# Patient Record
Sex: Male | Born: 1965 | Race: Asian | Hispanic: No | Marital: Married | Smoking: Current some day smoker
Health system: Southern US, Community
[De-identification: ages and names within clinical notes are randomized; demographics above are authoritative.]

## PROBLEM LIST (undated history)

## (undated) DIAGNOSIS — I10 Essential (primary) hypertension: Secondary | ICD-10-CM

## (undated) DIAGNOSIS — K219 Gastro-esophageal reflux disease without esophagitis: Secondary | ICD-10-CM

## (undated) HISTORY — PX: VASECTOMY: SHX75

---

## 2013-03-03 ENCOUNTER — Other Ambulatory Visit: Payer: Self-pay | Admitting: Infectious Diseases

## 2013-03-03 ENCOUNTER — Ambulatory Visit
Admission: RE | Admit: 2013-03-03 | Discharge: 2013-03-03 | Disposition: A | Discharge status: Home / Self Care | Payer: No Typology Code available for payment source | Source: Ambulatory Visit | Attending: Infectious Diseases | Admitting: Infectious Diseases

## 2013-03-03 DIAGNOSIS — Z111 Encounter for screening for respiratory tuberculosis: Secondary | ICD-10-CM

## 2014-10-01 ENCOUNTER — Other Ambulatory Visit: Payer: Self-pay | Admitting: Gastroenterology

## 2014-10-01 DIAGNOSIS — R1013 Epigastric pain: Secondary | ICD-10-CM

## 2014-10-15 ENCOUNTER — Ambulatory Visit
Admission: RE | Admit: 2014-10-15 | Discharge: 2014-10-15 | Disposition: A | Discharge status: Home / Self Care | Payer: Medicaid Other | Source: Ambulatory Visit | Attending: Gastroenterology | Admitting: Gastroenterology

## 2014-10-15 DIAGNOSIS — R1013 Epigastric pain: Secondary | ICD-10-CM

## 2016-11-26 ENCOUNTER — Emergency Department (HOSPITAL_COMMUNITY): Payer: BLUE CROSS/BLUE SHIELD

## 2016-11-26 ENCOUNTER — Emergency Department (HOSPITAL_COMMUNITY)
Admission: EM | Admit: 2016-11-26 | Discharge: 2016-11-26 | Disposition: A | Discharge status: Home / Self Care | Payer: BLUE CROSS/BLUE SHIELD | Attending: Emergency Medicine | Admitting: Emergency Medicine

## 2016-11-26 DIAGNOSIS — W1830XA Fall on same level, unspecified, initial encounter: Secondary | ICD-10-CM | POA: Insufficient documentation

## 2016-11-26 DIAGNOSIS — Y999 Unspecified external cause status: Secondary | ICD-10-CM | POA: Insufficient documentation

## 2016-11-26 DIAGNOSIS — R930 Abnormal findings on diagnostic imaging of skull and head, not elsewhere classified: Secondary | ICD-10-CM | POA: Diagnosis not present

## 2016-11-26 DIAGNOSIS — S52292A Other fracture of shaft of left ulna, initial encounter for closed fracture: Secondary | ICD-10-CM | POA: Diagnosis not present

## 2016-11-26 DIAGNOSIS — Y92002 Bathroom of unspecified non-institutional (private) residence single-family (private) house as the place of occurrence of the external cause: Secondary | ICD-10-CM | POA: Diagnosis not present

## 2016-11-26 DIAGNOSIS — Y939 Activity, unspecified: Secondary | ICD-10-CM | POA: Diagnosis not present

## 2016-11-26 DIAGNOSIS — S59912A Unspecified injury of left forearm, initial encounter: Secondary | ICD-10-CM | POA: Diagnosis present

## 2016-11-26 LAB — CBC WITH DIFFERENTIAL/PLATELET
BASOS PCT: 1 %
Basophils Absolute: 0 10*3/uL (ref 0.0–0.1)
EOS ABS: 0.2 10*3/uL (ref 0.0–0.7)
Eosinophils Relative: 3 %
HEMATOCRIT: 35.8 % — AB (ref 39.0–52.0)
HEMOGLOBIN: 12.3 g/dL — AB (ref 13.0–17.0)
LYMPHS ABS: 1 10*3/uL (ref 0.7–4.0)
Lymphocytes Relative: 18 %
MCH: 30.8 pg (ref 26.0–34.0)
MCHC: 34.4 g/dL (ref 30.0–36.0)
MCV: 89.5 fL (ref 78.0–100.0)
MONO ABS: 0.3 10*3/uL (ref 0.1–1.0)
MONOS PCT: 6 %
NEUTROS PCT: 72 %
Neutro Abs: 3.8 10*3/uL (ref 1.7–7.7)
Platelets: 177 10*3/uL (ref 150–400)
RBC: 4 MIL/uL — ABNORMAL LOW (ref 4.22–5.81)
RDW: 12.7 % (ref 11.5–15.5)
WBC: 5.3 10*3/uL (ref 4.0–10.5)

## 2016-11-26 LAB — I-STAT TROPONIN, ED: Troponin i, poc: 0 ng/mL (ref 0.00–0.08)

## 2016-11-26 LAB — I-STAT CHEM 8, ED
BUN: 11 mg/dL (ref 6–20)
CALCIUM ION: 1.11 mmol/L — AB (ref 1.15–1.40)
CREATININE: 0.9 mg/dL (ref 0.61–1.24)
Chloride: 106 mmol/L (ref 101–111)
Glucose, Bld: 96 mg/dL (ref 65–99)
HCT: 35 % — ABNORMAL LOW (ref 39.0–52.0)
HEMOGLOBIN: 11.9 g/dL — AB (ref 13.0–17.0)
Potassium: 3.9 mmol/L (ref 3.5–5.1)
SODIUM: 141 mmol/L (ref 135–145)
TCO2: 25 mmol/L (ref 0–100)

## 2016-11-26 MED ORDER — SODIUM CHLORIDE 0.9 % IV BOLUS (SEPSIS)
1000.0000 mL | Freq: Once | INTRAVENOUS | Status: AC
  Administered 2016-11-26: 1000 mL via INTRAVENOUS

## 2016-11-26 MED ORDER — DICLOFENAC SODIUM ER 100 MG PO TB24: 100.0000 mg | ORAL_TABLET | Freq: Every day | ORAL | 0 refills | Status: DC

## 2016-11-26 MED ORDER — KETOROLAC TROMETHAMINE 30 MG/ML IJ SOLN
30.0000 mg | Freq: Once | INTRAMUSCULAR | Status: AC
  Administered 2016-11-26: 30 mg via INTRAVENOUS
  Filled 2016-11-26: qty 1

## 2016-11-26 NOTE — ED Notes (Signed)
No respiratory or acute distress noted alert and oriented x 3 call light in reach. 

## 2016-11-26 NOTE — ED Notes (Signed)
Patient transported to X-ray, NS bolus was started on pt.

## 2016-11-26 NOTE — ED Provider Notes (Signed)
[facility]-EMERGENCY DEPT Provider Note   CSN: [id number] Arrival date & time: 11/26/16  0003 By signing my name below, I, Bridgette Habermann, attest that this documentation has been prepared under the direction and in the presence of Geordan Xu, MD. Electronically Signed: Bridgette Habermann, ED Scribe. 11/26/16. 12:16 AM.  History   Chief Complaint Chief Complaint  Patient presents with  . Fall    left wrist and dental pain    HPI Comments: Luke Hernandez is a 50 y.o. male with no pertinent PMHx, who presents to the Emergency Department by EMS complaining of 6/10 left wrist pain s/p mechanical fall one hour ago. Pt reports he fell in the bathroom while coming out of the shower; he felt room-spinning dizziness just prior to his fall. Pt states he always is dizzy at baseline but notes this episode was worse. Pain is exacerbated with movement and palpation. He has not tried any OTC medications PTA. Pt is not a smoker and does not drink alcohol. He denies fever, chills, or any other associated symptoms.   The history is provided by the patient. A language interpreter was used.  Wrist Pain  This is a new problem. The current episode started less than 1 hour ago. The problem occurs constantly. The problem has not changed since onset.The symptoms are aggravated by bending, twisting and standing. Nothing relieves the symptoms. He has tried nothing for the symptoms. The treatment provided no relief.    No past medical history on file.  There are no active problems to display for this patient.  No past surgical history on file.   Home Medications    Prior to Admission medications   Not on File    Family History No family history on file.  Social History Social History  Substance Use Topics  . Smoking status: Not on file  . Smokeless tobacco: Not on file  . Alcohol use Not on file     Allergies   Patient has no allergy information on record.   Review of Systems Review of Systems  Constitutional:  Negative for chills and fever.  Musculoskeletal: Positive for myalgias.  Neurological: Positive for dizziness.  All other systems reviewed and are negative.    Physical Exam Updated Vital Signs BP 148/96 (BP Location: Right Arm)   Pulse 111   Temp 98.2 F (36.8 C) (Oral)   Resp 20   SpO2 98%   Physical Exam  Constitutional: He is oriented to person, place, and time. He appears well-developed and well-nourished.  HENT:  Head: Normocephalic.  Right Ear: No hemotympanum.  Left Ear: No hemotympanum.  Mouth/Throat: Oropharynx is clear and moist. No oropharyngeal exudate.  Eyes: Conjunctivae and EOM are normal. Pupils are equal, round, and reactive to light. Right eye exhibits no discharge. Left eye exhibits no discharge. No scleral icterus.  Neck: Normal range of motion. Neck supple. No JVD present. No tracheal deviation present.  Trachea is midline. No stridor or carotid bruits.  Cardiovascular: Normal rate, regular rhythm, normal heart sounds and intact distal pulses.   No murmur heard. Pulmonary/Chest: Effort normal and breath sounds normal. No stridor. No respiratory distress. He has no wheezes. He has no rales.  Lungs CTA bilaterally.  Abdominal: Soft. Bowel sounds are normal. He exhibits no distension. There is no tenderness. There is no rebound and no guarding.  Musculoskeletal: Normal range of motion. He exhibits tenderness. He exhibits no edema or deformity.  All compartments are soft. No palpable cords. No deformities of the lower extremity.  Tenderness to palpation over left medial wrist. No obvious deformity. Medial pulse 3+.   Lymphadenopathy:    He has no cervical adenopathy.  Neurological: He is alert and oriented to person, place, and time. He has normal reflexes. He displays normal reflexes.  Skin: Skin is warm and dry. Capillary refill takes less than 2 seconds.  Psychiatric: He has a normal mood and affect. His behavior is normal.  Nursing note and vitals  reviewed.    ED Treatments / Results  DIAGNOSTIC STUDIES: Oxygen Saturation is 98% on RA, normal by my interpretation.    COORDINATION OF CARE: 12:15 AM Discussed treatment plan with pt at bedside which includes x-ray and pt agreed to plan.   EKG  EKG Interpretation  Date/Time:  Monday November 26 2016 00:20:42 EST Ventricular Rate:  82 PR Interval:    QRS Duration: 97 QT Interval:  370 QTC Calculation: 433 R Axis:   59 Text Interpretation:  Sinus rhythm Confirmed by [facility]  [facility], Nkosi Cortright ([id number]) on 11/26/2016 12:24:53 AM      Results for orders placed or performed during the hospital encounter of 11/26/16  CBC with Differential/Platelet  Result Value Ref Range   WBC 5.3 4.0 - 10.5 K/uL   RBC 4.00 (L) 4.22 - 5.81 MIL/uL   Hemoglobin 12.3 (L) 13.0 - 17.0 g/dL   HCT 60.435.8 (L) 54.039.0 - 98.152.0 %   MCV 89.5 78.0 - 100.0 fL   MCH 30.8 26.0 - 34.0 pg   MCHC 34.4 30.0 - 36.0 g/dL   RDW 19.112.7 47.811.5 - 29.515.5 %   Platelets 177 150 - 400 K/uL   Neutrophils Relative % 72 %   Neutro Abs 3.8 1.7 - 7.7 K/uL   Lymphocytes Relative 18 %   Lymphs Abs 1.0 0.7 - 4.0 K/uL   Monocytes Relative 6 %   Monocytes Absolute 0.3 0.1 - 1.0 K/uL   Eosinophils Relative 3 %   Eosinophils Absolute 0.2 0.0 - 0.7 K/uL   Basophils Relative 1 %   Basophils Absolute 0.0 0.0 - 0.1 K/uL  I-Stat Chem 8, ED  Result Value Ref Range   Sodium 141 135 - 145 mmol/L   Potassium 3.9 3.5 - 5.1 mmol/L   Chloride 106 101 - 111 mmol/L   BUN 11 6 - 20 mg/dL   Creatinine, Ser 6.210.90 0.61 - 1.24 mg/dL   Glucose, Bld 96 65 - 99 mg/dL   Calcium, Ion 3.081.11 (L) 1.15 - 1.40 mmol/L   TCO2 25 0 - 100 mmol/L   Hemoglobin 11.9 (L) 13.0 - 17.0 g/dL   HCT 65.735.0 (L) 84.639.0 - 96.252.0 %  I-stat troponin, ED  Result Value Ref Range   Troponin i, poc 0.00 0.00 - 0.08 ng/mL   Comment 3           Dg Wrist Complete Left  Result Date: 11/26/2016 CLINICAL DATA:  Status post fall, with left wrist pain. Initial encounter. EXAM: LEFT WRIST -  COMPLETE 3+ VIEW COMPARISON:  None. FINDINGS: There is a mildly displaced fracture through the distal ulnar diaphysis, with mild ulnar displacement. Surrounding soft tissue swelling is noted. The distal radius appears intact. The carpal rows are grossly unremarkable in appearance. Visualized joint spaces are preserved. IMPRESSION: Mildly displaced fracture through the distal ulnar diaphysis, with mild ulnar displacement. Electronically Signed   By: Roanna RaiderJeffery  Chang M.D.   On: 11/26/2016 01:13   Ct Head Wo Contrast  Result Date: 11/26/2016 CLINICAL DATA:  Acute onset of dizziness while getting out  of shower. Status post fall. Concern for head or cervical spine injury. Initial encounter. EXAM: CT HEAD WITHOUT CONTRAST CT CERVICAL SPINE WITHOUT CONTRAST TECHNIQUE: Multidetector CT imaging of the head and cervical spine was performed following the standard protocol without intravenous contrast. Multiplanar CT image reconstructions of the cervical spine were also generated. COMPARISON:  None. FINDINGS: CT HEAD FINDINGS Brain: No evidence of acute infarction, hemorrhage, hydrocephalus, extra-axial collection or mass lesion/mass effect. The posterior fossa, including the cerebellum, brainstem and fourth ventricle, is within normal limits. The third and lateral ventricles, and basal ganglia are unremarkable in appearance. The cerebral hemispheres are symmetric in appearance, with normal gray-white differentiation. No mass effect or midline shift is seen. Vascular: No hyperdense vessel or unexpected calcification. Skull: There is no evidence of fracture; visualized osseous structures are unremarkable in appearance. Sinuses/Orbits: The orbits are within normal limits. The paranasal sinuses and mastoid air cells are well-aerated. Other: No significant soft tissue abnormalities are seen. CT CERVICAL SPINE FINDINGS Alignment: Normal. Skull base and vertebrae: No acute fracture. No primary bone lesion or focal pathologic  process. Soft tissues and spinal canal: No prevertebral fluid or swelling. No visible canal hematoma. Disc levels: Intervertebral disc spaces are preserved. The bony foramina are grossly unremarkable in appearance. Upper chest: The thyroid gland is mildly enlarged and heterogeneous in appearance. The visualized lung apices are clear. Other: No additional soft tissue abnormalities are seen. IMPRESSION: 1. No evidence of traumatic intracranial injury or fracture. 2. No evidence of fracture or subluxation along the cervical spine. Electronically Signed   By: Roanna RaiderJeffery  Chang M.D.   On: 11/26/2016 01:10   Ct Cervical Spine Wo Contrast  Result Date: 11/26/2016 CLINICAL DATA:  Acute onset of dizziness while getting out of shower. Status post fall. Concern for head or cervical spine injury. Initial encounter. EXAM: CT HEAD WITHOUT CONTRAST CT CERVICAL SPINE WITHOUT CONTRAST TECHNIQUE: Multidetector CT imaging of the head and cervical spine was performed following the standard protocol without intravenous contrast. Multiplanar CT image reconstructions of the cervical spine were also generated. COMPARISON:  None. FINDINGS: CT HEAD FINDINGS Brain: No evidence of acute infarction, hemorrhage, hydrocephalus, extra-axial collection or mass lesion/mass effect. The posterior fossa, including the cerebellum, brainstem and fourth ventricle, is within normal limits. The third and lateral ventricles, and basal ganglia are unremarkable in appearance. The cerebral hemispheres are symmetric in appearance, with normal gray-white differentiation. No mass effect or midline shift is seen. Vascular: No hyperdense vessel or unexpected calcification. Skull: There is no evidence of fracture; visualized osseous structures are unremarkable in appearance. Sinuses/Orbits: The orbits are within normal limits. The paranasal sinuses and mastoid air cells are well-aerated. Other: No significant soft tissue abnormalities are seen. CT CERVICAL SPINE  FINDINGS Alignment: Normal. Skull base and vertebrae: No acute fracture. No primary bone lesion or focal pathologic process. Soft tissues and spinal canal: No prevertebral fluid or swelling. No visible canal hematoma. Disc levels: Intervertebral disc spaces are preserved. The bony foramina are grossly unremarkable in appearance. Upper chest: The thyroid gland is mildly enlarged and heterogeneous in appearance. The visualized lung apices are clear. Other: No additional soft tissue abnormalities are seen. IMPRESSION: 1. No evidence of traumatic intracranial injury or fracture. 2. No evidence of fracture or subluxation along the cervical spine. Electronically Signed   By: Roanna RaiderJeffery  Chang M.D.   On: 11/26/2016 01:10   Dg Hand Complete Left  Result Date: 11/26/2016 CLINICAL DATA:  Status post fall, with left wrist pain. Initial encounter. EXAM: LEFT HAND -  COMPLETE 3+ VIEW COMPARISON:  None. FINDINGS: There is no evidence of fracture or dislocation. The joint spaces are preserved. The carpal rows are intact, and demonstrate normal alignment. The soft tissues are unremarkable in appearance. IMPRESSION: No evidence of fracture or dislocation. Electronically Signed   By: Roanna Raider M.D.   On: 11/26/2016 01:14     Procedures Procedures (including critical care time)  Medications Ordered in ED Medications  ketorolac (TORADOL) 30 MG/ML injection 30 mg (not administered)  sodium chloride 0.9 % bolus 1,000 mL (0 mLs Intravenous Stopped 11/26/16 0133)      Final Clinical Impressions(s) / ED Diagnoses   Ulnar wrist fracture, consistent with defensive injury not a fall.  Patient is well known to scribe who states he is a drinker. Given this information I do not think it prudent to prescribe narcotics to this patient. Splinted in the ED will need to follow up with orthopedics for ongoing treatment.    All questions answered to patient's satisfaction. Based on history and exam patient has been appropriately  medically screened and emergency conditions excluded. Patient is stable for discharge at this time. Follow up with your PMD for recheck in 2 days and strict return precautions given.   I personally performed the services described in this documentation, which was scribed in my presence. The recorded information has been reviewed and is accurate.       Cy Blamer, MD 11/26/16 [phone]

## 2016-11-26 NOTE — ED Triage Notes (Signed)
V/s on arrival 169/109., hr 81, room air 94, rr16 reg, cbg 113.  LVh on ekg

## 2016-11-26 NOTE — ED Notes (Signed)
Bed: [id number] Expected date:  Expected time:  Means of arrival:  Comments: 50yo/Fall/L wrist pain

## 2016-11-26 NOTE — ED Notes (Signed)
Pt has pain in left wrist.  Wrist elevated on a pillow.  Will notify EDP

## 2016-11-26 NOTE — Progress Notes (Signed)
Orthopedic Tech Progress Note Patient Details:  Luke Hernandez 08/01/1966 [id number]  Ortho Devices Type of Ortho Device: Arm sling, Sugartong splint Ortho Device/Splint Location: lue Ortho Device/Splint Interventions: Ordered, Application Applied splint as per drs verbal order  [name], [name] 11/26/2016, 3:37 AM

## 2016-11-26 NOTE — ED Triage Notes (Signed)
Pt comes to ed, via ems, fall one hour ago coming out of shower. Pt is needing interrupter napa lese.  Pt reports dizzness before his fall. Pt also verbalizes left wrist pain. No deformity noted. Pain 6 out 10. Pt hx of non active TB. 18 in RFA, fentyle 100 mcg given.   Pt comes from home, [address]. Phone contact [phone] wife.

## 2016-12-03 ENCOUNTER — Ambulatory Visit (INDEPENDENT_AMBULATORY_CARE_PROVIDER_SITE_OTHER): Payer: BLUE CROSS/BLUE SHIELD | Admitting: Physician Assistant

## 2016-12-03 ENCOUNTER — Encounter (INDEPENDENT_AMBULATORY_CARE_PROVIDER_SITE_OTHER): Payer: Self-pay

## 2016-12-03 DIAGNOSIS — S52202G Unspecified fracture of shaft of left ulna, subsequent encounter for closed fracture with delayed healing: Secondary | ICD-10-CM

## 2016-12-03 NOTE — Progress Notes (Signed)
   Office Visit Note   Patient: Luke Hernandez           Date of Birth: 10/29/1966           MRN: [id number] Visit Date: 12/03/2016              Requested by: No referring provider defined for this encounter. PCP: No PCP Per Patient   Assessment & Plan: Visit Diagnoses: No diagnosis found.  Plan: Continue with long-arm splint. See him back in a week remove the splint obtain x-rays of the distal ulna . Is likely at that time we'll place him in a cast. Placed him out of work and most likely be out of work for 6-8 weeks.  Follow-Up Instructions: Return in about 1 week (around 12/10/2016) for Radiographs.   Orders:  No orders of the defined types were placed in this encounter.  No orders of the defined types were placed in this encounter.     Procedures: No procedures performed   Clinical Data: No additional findings.   Subjective: Chief Complaint  Patient presents with  . Left Wrist - Fracture    New pt, left wrist pain, broken, pt got out of shower walked to bed and fell went to ED [facility] 12/04, has some pain now, ED gave medication but states is not working.   3 views of the left distal radius ulna shows a left ulna distal diaphysis fracture mildly displaced. No other fractures of the hand or wrist  Review of Systems See HPI   Objective: Vital Signs: There were no vitals taken for this visit.  Physical Exam  Constitutional: He is oriented to person, place, and time. He appears well-developed and well-nourished. No distress.  Neurological: He is alert and oriented to person, place, and time.  Psychiatric: He has a normal mood and affect.    Ortho Exam Left arm well padded long-arm splint.No tenderness at the elbow. Left hand full motor and full sensation. Specialty Comments:  No specialty comments available.  Imaging: No results found.   PMFS History: There are no active problems to display for this patient.  No past medical history on file.  No family  history on file.  No past surgical history on file. Social History   Occupational History  . Not on file.   Social History Main Topics  . Smoking status: Not on file  . Smokeless tobacco: Not on file  . Alcohol use Not on file  . Drug use: Unknown  . Sexual activity: Not on file

## 2016-12-10 ENCOUNTER — Ambulatory Visit (INDEPENDENT_AMBULATORY_CARE_PROVIDER_SITE_OTHER): Payer: BLUE CROSS/BLUE SHIELD | Admitting: Physician Assistant

## 2016-12-10 ENCOUNTER — Ambulatory Visit (INDEPENDENT_AMBULATORY_CARE_PROVIDER_SITE_OTHER): Payer: Self-pay

## 2016-12-10 DIAGNOSIS — S52202G Unspecified fracture of shaft of left ulna, subsequent encounter for closed fracture with delayed healing: Secondary | ICD-10-CM

## 2016-12-10 MED ORDER — TRAMADOL HCL 50 MG PO TABS: 50.0000 mg | ORAL_TABLET | Freq: Four times a day (QID) | ORAL | 0 refills | Status: DC | PRN

## 2016-12-10 NOTE — Progress Notes (Signed)
   Office Visit Note   Patient: Luke Hernandez           Date of Birth: 09/28/1966           MRN: [id number] Visit Date: 12/10/2016              Requested by: No referring provider defined for this encounter. PCP: No PCP Per Patient   Assessment & Plan: Visit Diagnoses:  1. Closed fracture of shaft of left ulna with delayed healing, unspecified fracture morphology, subsequent encounter     Plan:Short arm cast left arm. He is to keep it clean dry and intact see him back in 3 weeks remove the cast and obtain 3 views of the left distal forearm. Tramadol for pain.  Follow-Up Instructions: Return in about 3 weeks (around 12/31/2016).   Orders:  Orders Placed This Encounter  Procedures  . XR Wrist Complete Left   Meds ordered this encounter  Medications  . traMADol (ULTRAM) 50 MG tablet    Sig: Take 1 tablet (50 mg total) by mouth every 6 (six) hours as needed.    Dispense:  30 tablet    Refill:  0      Procedures: No procedures performed   Clinical Data: No additional findings.   Subjective: Chief Complaint  Patient presents with  . Left Wrist - Follow-up    Patient is following up his wrist fracture. He is wearing his thumb spica splint today. He states he's still in a lot of pain.     Review of Systems   Objective: Vital Signs: There were no vitals taken for this visit.  Physical Exam  Ortho Exam Left forearm splints removed no skin breakdown. Radial pulses intact. Is able to wiggle his fingers and has good sensation throughout Specialty Comments:  No specialty comments available.  Imaging: No results found.   PMFS History: There are no active problems to display for this patient.  No past medical history on file.  No family history on file.  No past surgical history on file. Social History   Occupational History  . Not on file.   Social History Main Topics  . Smoking status: Not on file  . Smokeless tobacco: Not on file  . Alcohol use Not on  file  . Drug use: Unknown  . Sexual activity: Not on file

## 2016-12-31 ENCOUNTER — Encounter (INDEPENDENT_AMBULATORY_CARE_PROVIDER_SITE_OTHER): Payer: Self-pay | Admitting: Physician Assistant

## 2016-12-31 ENCOUNTER — Ambulatory Visit (INDEPENDENT_AMBULATORY_CARE_PROVIDER_SITE_OTHER): Payer: BLUE CROSS/BLUE SHIELD | Admitting: Physician Assistant

## 2016-12-31 ENCOUNTER — Ambulatory Visit (INDEPENDENT_AMBULATORY_CARE_PROVIDER_SITE_OTHER): Payer: Self-pay

## 2016-12-31 DIAGNOSIS — M25532 Pain in left wrist: Secondary | ICD-10-CM

## 2016-12-31 DIAGNOSIS — S52222S Displaced transverse fracture of shaft of left ulna, sequela: Secondary | ICD-10-CM

## 2016-12-31 NOTE — Progress Notes (Signed)
   Office Visit Note   Patient: Luke Hernandez           Date of Birth: 02/24/1966           MRN: [id number] Visit Date: 12/31/2016              Requested by: No referring provider defined for this encounter. PCP: No PCP Per Patient   Assessment & Plan: Visit Diagnoses:  1. Closed displaced transverse fracture of shaft of left ulna, sequela     Plan: Placed in a removable long Velcro wrist splint, he will keep this on and treated like a cast except for bathing. We will see him back in 1 month obtain AP and lateral views of his left forearm at that point time most likely we'll discharge him.  Follow-Up Instructions: Return in about 4 weeks (around 01/28/2017) for Radiographs.   Orders:  Orders Placed This Encounter  Procedures  . XR Forearm Left   No orders of the defined types were placed in this encounter.     Procedures: No procedures performed   Clinical Data: No additional findings.   Subjective: Chief Complaint  Patient presents with  . Left Wrist - Follow-up  . Follow-up    HPI Mr.Vandenbrink returns today over a month status post distal left ulnar shaft fracture. He is overall doing well, no complaints. Her brothers present today and aids in conversation. Review of Systems   Objective: Vital Signs: There were no vitals taken for this visit.  Physical Exam  Ortho Exam Left forearm he has full supination pronation. Radial pulses intact. Motor and sensation intact left hand. Tenderness over the fracture site with abundant palpable callus. No rashes skin lesions ulcerations impending ulcers left forearm after the removal of the Cast. Specialty Comments:  No specialty comments available.  Imaging: No results found.   PMFS History: There are no active problems to display for this patient.  No past medical history on file.  No family history on file.  No past surgical history on file. Social History   Occupational History  . Not on file.   Social History Main  Topics  . Smoking status: Never Smoker  . Smokeless tobacco: Current User  . Alcohol use Not on file  . Drug use: Unknown  . Sexual activity: Not on file

## 2017-01-31 ENCOUNTER — Ambulatory Visit (INDEPENDENT_AMBULATORY_CARE_PROVIDER_SITE_OTHER): Payer: Self-pay

## 2017-01-31 ENCOUNTER — Ambulatory Visit (INDEPENDENT_AMBULATORY_CARE_PROVIDER_SITE_OTHER): Payer: BLUE CROSS/BLUE SHIELD | Admitting: Physician Assistant

## 2017-01-31 DIAGNOSIS — M79632 Pain in left forearm: Secondary | ICD-10-CM | POA: Diagnosis not present

## 2017-01-31 DIAGNOSIS — S52202G Unspecified fracture of shaft of left ulna, subsequent encounter for closed fracture with delayed healing: Secondary | ICD-10-CM

## 2017-01-31 NOTE — Progress Notes (Signed)
   Office Visit Note   Patient: Luke Hernandez           Date of Birth: 10/12/1966           MRN: [id number] Visit Date: 01/31/2017              Requested by: No referring provider defined for this encounter. PCP: No PCP Per Patient   Assessment & Plan: Visit Diagnoses:  1. Left forearm pain   2. Unspecified fracture of shaft of left ulna, subsequent encounter for closed fracture with delayed healing     Plan: Continue the removable Velcro splint long arm using as a cast only coming out for bathing. No lifting with the arm. We'll keep him out of work until follow-up in one month. At that time we'll obtain AP and lateral views of the left forearm to evaluate the ulnar shaft fracture.  Follow-Up Instructions: Return in about 4 weeks (around 02/28/2017) for Radiographs.   Orders:  Orders Placed This Encounter  Procedures  . XR Forearm Left   No orders of the defined types were placed in this encounter.     Procedures: No procedures performed   Clinical Data: No additional findings.   Subjective: Chief Complaint  Patient presents with  . Left Wrist - Follow-up    HPI Status post left ulnar shaft fracture 11/26/2016. He follows up today wearing the Velcro long-arm splint. He states overall that the pain is improving but that he still has pain. Review of Systems   Objective: Vital Signs: There were no vitals taken for this visit.  Physical Exam  Ortho Exam Left arm he has full supination pronation is good range of motion of the wrist. Palpable callus over the ulnar shaft at the fracture site. Minimal tenderness. Radial pulses 2+. Specialty Comments:  No specialty comments available.  Imaging: Xr Forearm Left  Result Date: 01/31/2017 AP lateral views of the left forearm shows the ulnar shaft fracture to remain in overall good position alignment. There is good callus formation. Fracture site however is still visible.    PMFS History: There are no active problems to  display for this patient.  No past medical history on file.  No family history on file.  No past surgical history on file. Social History   Occupational History  . Not on file.   Social History Main Topics  . Smoking status: Never Smoker  . Smokeless tobacco: Current User  . Alcohol use Not on file  . Drug use: Unknown  . Sexual activity: Not on file

## 2017-02-28 ENCOUNTER — Ambulatory Visit (INDEPENDENT_AMBULATORY_CARE_PROVIDER_SITE_OTHER): Payer: BLUE CROSS/BLUE SHIELD | Admitting: Physician Assistant

## 2017-02-28 ENCOUNTER — Ambulatory Visit (INDEPENDENT_AMBULATORY_CARE_PROVIDER_SITE_OTHER): Payer: BLUE CROSS/BLUE SHIELD

## 2017-02-28 DIAGNOSIS — S5292XK Unspecified fracture of left forearm, subsequent encounter for closed fracture with nonunion: Secondary | ICD-10-CM

## 2017-02-28 DIAGNOSIS — M79632 Pain in left forearm: Secondary | ICD-10-CM

## 2017-02-28 NOTE — Progress Notes (Signed)
   Office Visit Note   Patient: Luke Hernandez           Date of Birth: 03/21/1966           MRN: [id number] Visit Date: 02/28/2017              Requested by: No referring provider defined for this encounter. PCP: No PCP Per Patient   Assessment & Plan: Visit Diagnoses:  1. Left forearm pain   2. Closed left forearm fracture, with nonunion, subsequent encounter     Plan: We will try to gain approval for a bone stimulator . See him back in a month obtain AP lateral views of his left forearm. Keep him out of work until follow-up in one month. No heavy lifting with the left arm.  Follow-Up Instructions: Return in about 4 weeks (around 03/28/2017) for Radiographs.   Orders:  Orders Placed This Encounter  Procedures  . XR Forearm Left   No orders of the defined types were placed in this encounter.     Procedures: No procedures performed   Clinical Data: No additional findings.   Subjective: No chief complaint on file.   HPI Asa returns today for follow-up of his left forearm ulnar shaft fracture. He again injured this on 11/26/2016. He states he still having pain in the arm. Review of Systems   Objective: Vital Signs: There were no vitals taken for this visit.  Physical Exam  Ortho Exam Left forearm he has full supination pronation forearm. Radial pulse 2+ sensation intact throughout the hand to light touch. No rashes skin lesions ulcerations erythema left forearm. He has palpable callus over the left distal ulnar shaft fracture. Tenderness over the fracture site with palpation Specialty Comments:  No specialty comments available.  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 No change in overall position alignment. No other fractures been applied.    PMFS History: There are no active problems to display for this patient.  No past medical history on file.  No family history on  file.  No past surgical history on file. Social History   Occupational History  . Not on file.   Social History Main Topics  . Smoking status: Never Smoker  . Smokeless tobacco: Current User  . Alcohol use Not on file  . Drug use: Unknown  . Sexual activity: Not on file

## 2017-03-29 ENCOUNTER — Ambulatory Visit (INDEPENDENT_AMBULATORY_CARE_PROVIDER_SITE_OTHER): Payer: BLUE CROSS/BLUE SHIELD | Admitting: Physician Assistant

## 2017-04-17 ENCOUNTER — Ambulatory Visit (INDEPENDENT_AMBULATORY_CARE_PROVIDER_SITE_OTHER): Payer: BLUE CROSS/BLUE SHIELD | Admitting: Physician Assistant

## 2019-02-03 ENCOUNTER — Telehealth (INDEPENDENT_AMBULATORY_CARE_PROVIDER_SITE_OTHER): Payer: Self-pay | Admitting: Physician Assistant

## 2019-02-03 NOTE — Telephone Encounter (Signed)
Patient called and left vm in ref to an interpreter being here for his appt. Tried to call patient back and number on acct not a working number per recording.

## 2019-02-12 ENCOUNTER — Ambulatory Visit (INDEPENDENT_AMBULATORY_CARE_PROVIDER_SITE_OTHER): Payer: BLUE CROSS/BLUE SHIELD | Admitting: Physician Assistant

## 2019-02-12 ENCOUNTER — Ambulatory Visit (INDEPENDENT_AMBULATORY_CARE_PROVIDER_SITE_OTHER): Payer: BLUE CROSS/BLUE SHIELD

## 2019-02-12 ENCOUNTER — Encounter (INDEPENDENT_AMBULATORY_CARE_PROVIDER_SITE_OTHER): Payer: Self-pay | Admitting: Physician Assistant

## 2019-02-12 DIAGNOSIS — M79632 Pain in left forearm: Secondary | ICD-10-CM | POA: Diagnosis not present

## 2019-02-12 NOTE — Progress Notes (Signed)
Office Visit Note   Patient: Luke Hernandez           Date of Birth: 1966-05-25           MRN: [id number] Visit Date: 02/12/2019              Requested by: No referring provider defined for this encounter. PCP: Patient, No Pcp Per   Assessment & Plan: Visit Diagnoses:  1. Left forearm pain     Plan: Due to the fact that his forearm pain seems to be related to his job will place him on automated machine at work.  No lifting greater than 5 pounds with the left arm.  We will keep him on these restrictions for the next 3 months and have him follow-up with [place] at that time most likely to return to full duties.  He is to take an over-the-counter anti-inflammatory like Aleve twice daily with food.  Questions are encouraged and answered at length.  Interpreter was used throughout the examination today.  Follow-Up Instructions: Return in about 3 months (around 05/13/2019).   Orders:  Orders Placed This Encounter  Procedures  . XR Forearm Left   No orders of the defined types were placed in this encounter.     Procedures: No procedures performed   Clinical Data: No additional findings.   Subjective: Chief Complaint  Patient presents with  . Left Hand - Pain    HPI Luke Hernandez comes in today due to pain in his left forearm.  He was last seen in March 2018 with a distal left ulnar shaft fracture was to follow-up in a month.  We are also working on a bone stimulator.  He did not get the bone stimulator and did not follow-up with [place].  He states that his arm eventually got better.  However over the last 2 months he has had increasing pain in the left forearm.  Said no new injury.  He states he is currently working using his hands a lot.  He states he has no pain in the arm on the weekends.  Pain is predominantly worse after working all day.  Has not taken anything for the pain.  Is noticed any swelling in the arm or hand left hand.  Denies any numbness or tingling down the right arm.  Patient is  from [place] and interpreter is used throughout the examination today . Review of Systems   Objective: Vital Signs: There were no vitals taken for this visit.  Physical Exam Constitutional:      Appearance: He is not ill-appearing or diaphoretic.  Pulmonary:     Effort: Pulmonary effort is normal.  Neurological:     Mental Status: He is alert and oriented to person, place, and time.  Psychiatric:        Mood and Affect: Mood normal.     Ortho Exam Bilateral arms he has full supination pronation and full range of motion of the elbows.  Negative Tinel's at the wrist bilaterally.  Full sensation light touch throughout both hands.  Radial pulses are 2+.  There is no rash skin lesions ulcerations.  No discrepancy in size or muscle atrophy appreciated in either forearm.  He has tenderness over the distal left ulnar shaft. Specialty Comments:  No specialty comments available.  Imaging: Xr Forearm Left  Result Date: 02/12/2019 Left forearm 2 views: No acute fractures.  The left distal ulnar shaft fracture is well-healed.  Overall good position alignment.  No other bony abnormalities seen.  PMFS History: There are no active problems to display for this patient.  History reviewed. No pertinent past medical history.  History reviewed. No pertinent family history.  History reviewed. No pertinent surgical history. Social History   Occupational History  . Not on file  Tobacco Use  . Smoking status: Never Smoker  . Smokeless tobacco: Current User  Substance and Sexual Activity  . Alcohol use: Not on file  . Drug use: Not on file  . Sexual activity: Not on file

## 2019-03-04 ENCOUNTER — Ambulatory Visit: Payer: Self-pay | Admitting: Family Medicine

## 2019-05-01 ENCOUNTER — Ambulatory Visit (INDEPENDENT_AMBULATORY_CARE_PROVIDER_SITE_OTHER): Payer: BLUE CROSS/BLUE SHIELD

## 2019-05-01 ENCOUNTER — Ambulatory Visit (HOSPITAL_COMMUNITY)
Admission: EM | Admit: 2019-05-01 | Discharge: 2019-05-01 | Disposition: A | Discharge status: Home / Self Care | Payer: BLUE CROSS/BLUE SHIELD | Attending: Family Medicine | Admitting: Family Medicine

## 2019-05-01 ENCOUNTER — Encounter (HOSPITAL_COMMUNITY): Payer: Self-pay

## 2019-05-01 ENCOUNTER — Other Ambulatory Visit: Payer: Self-pay

## 2019-05-01 DIAGNOSIS — B349 Viral infection, unspecified: Secondary | ICD-10-CM | POA: Diagnosis not present

## 2019-05-01 MED ORDER — ACETAMINOPHEN 500 MG PO TABS: 500.0000 mg | ORAL_TABLET | Freq: Four times a day (QID) | ORAL | 0 refills | Status: DC | PRN

## 2019-05-01 MED ORDER — IBUPROFEN 600 MG PO TABS: 600.0000 mg | ORAL_TABLET | Freq: Four times a day (QID) | ORAL | 0 refills | Status: DC | PRN

## 2019-05-01 NOTE — ED Triage Notes (Addendum)
Patient presents to Urgent Care with complaints of feeling feverish, having generalized body aches (mostly back pain) and headache since 3 days ago. Pt also requesting note for work.

## 2019-05-01 NOTE — ED Provider Notes (Signed)
[facility]    CSN: [id number] Arrival date & time: 05/01/19  1149     History   Chief Complaint Chief Complaint  Patient presents with  . Cough  . Headache    HPI Georgette Doverrjun Thome is a 53 y.o. male.   Patient is a 53 year old male with no significant past medical history.  He presents today with approximately 5 days of fever, cough, body aches, headache.  The body aches is mostly in the upper back.  The fever has been waxing and waning.  He has not been taking any medication for his symptoms.  Denies any known sick contacts or COVID exposures.  No recent traveling.  No ear pain, sore throat. He does not smoke.  Denies any chest pain, shortness of breath or palpitations. He does not smoke.   ROS per HPI      History reviewed. No pertinent past medical history.  There are no active problems to display for this patient.   History reviewed. No pertinent surgical history.     Home Medications    Prior to Admission medications   Medication Sig Start Date End Date Taking? Authorizing Provider  acetaminophen (TYLENOL) 500 MG tablet Take 1 tablet (500 mg total) by mouth every 6 (six) hours as needed. 05/01/19   Dahlia ByesBast, Ren Aspinall A, NP  ibuprofen (ADVIL) 600 MG tablet Take 1 tablet (600 mg total) by mouth every 6 (six) hours as needed. 05/01/19   Janace ArisBast, Brenya Taulbee A, NP    Family History History reviewed. No pertinent family history.  Social History Social History   Tobacco Use  . Smoking status: Never Smoker  . Smokeless tobacco: Current User  Substance Use Topics  . Alcohol use: Not on file  . Drug use: Not on file     Allergies   Patient has no known allergies.   Review of Systems Review of Systems   Physical Exam Triage Vital Signs ED Triage Vitals  Enc Vitals Group     BP 05/01/19 1210 119/73     Pulse Rate 05/01/19 1210 78     Resp 05/01/19 1210 18     Temp 05/01/19 1210 98.8 F (37.1 C)     Temp Source 05/01/19 1210 Oral     SpO2 05/01/19 1210 100 %    Weight --      Height --      Head Circumference --      Peak Flow --      Pain Score 05/01/19 1208 0     Pain Loc --      Pain Edu? --      Excl. in GC? --    No data found.  Updated Vital Signs BP 119/73 (BP Location: Left Arm)   Pulse 78   Temp 98.8 F (37.1 C) (Oral)   Resp 18   SpO2 100%   Visual Acuity Right Eye Distance:   Left Eye Distance:   Bilateral Distance:    Right Eye Near:   Left Eye Near:    Bilateral Near:     Physical Exam Vitals signs and nursing note reviewed.  Constitutional:      General: He is not in acute distress.    Appearance: He is well-developed. He is not ill-appearing, toxic-appearing or diaphoretic.  HENT:     Head: Normocephalic and atraumatic.  Eyes:     Conjunctiva/sclera: Conjunctivae normal.  Neck:     Musculoskeletal: Normal range of motion and neck supple.  Cardiovascular:  Rate and Rhythm: Normal rate and regular rhythm.     Heart sounds: No murmur.  Pulmonary:     Effort: Pulmonary effort is normal. No respiratory distress.     Breath sounds: Normal breath sounds.  Skin:    General: Skin is warm and dry.  Neurological:     Mental Status: He is alert.  Psychiatric:        Mood and Affect: Mood normal.      UC Treatments / Results  Labs (all labs ordered are listed, but only abnormal results are displayed) Labs Reviewed - No data to display  EKG None  Radiology Dg Chest 2 View  Result Date: 05/01/2019 CLINICAL DATA:  53 year old with cough and chest pain. EXAM: CHEST - 2 VIEW COMPARISON:  03/03/2013 FINDINGS: The heart size and mediastinal contours are within normal limits. Both lungs are clear. The visualized skeletal structures are unremarkable. No pleural effusions. IMPRESSION: No active cardiopulmonary disease. Electronically Signed   By: Richarda Overlie M.D.   On: 05/01/2019 13:13    Procedures Procedures (including critical care time)  Medications Ordered in UC Medications - No data to display   Initial Impression / Assessment and Plan / UC Course  I have reviewed the triage vital signs and the nursing notes.  Pertinent labs & imaging results that were available during my care of the patient were reviewed by me and considered in my medical decision making (see chart for details).    Viral illness  Chest x ray normal Most likely viral illness. Cannot rule out COVID-19 at this time.  There is high suspicion. We will have patient quarantine for 7 days.  He can return to work at that point if he is asymptomatic Tylenol or ibuprofen for fever, body aches Instructed that if his symptoms worsen or he becomes more short of breath he will need to go to the hospital.  Final Clinical Impressions(s) / UC Diagnoses   Final diagnoses:  Viral illness     Discharge Instructions     Your chest x-ray was normal This is most likely viral.  I cannot rule out COVID 19 at this time. We will have you quarantine for 1 week Make sure you are using precautions. You can take Tylenol or ibuprofen as needed for body aches, fever If your symptoms worsen and you start having more chest pain or worsening shortness of breath he went to go to the hospital.     ED Prescriptions    Medication Sig Dispense Auth. Provider   ibuprofen (ADVIL) 600 MG tablet Take 1 tablet (600 mg total) by mouth every 6 (six) hours as needed. 30 tablet Margit Batte A, NP   acetaminophen (TYLENOL) 500 MG tablet Take 1 tablet (500 mg total) by mouth every 6 (six) hours as needed. 30 tablet Dahlia Byes A, NP     Controlled Substance Prescriptions [place] Controlled Substance Registry consulted? Not Applicable   Janace Aris, NP 05/01/19 1329

## 2019-05-01 NOTE — Discharge Instructions (Signed)
Your chest x-ray was normal This is most likely viral.  I cannot rule out COVID 19 at this time. We will have you quarantine for 1 week Make sure you are using precautions. You can take Tylenol or ibuprofen as needed for body aches, fever If your symptoms worsen and you start having more chest pain or worsening shortness of breath he went to go to the hospital.

## 2019-05-14 ENCOUNTER — Ambulatory Visit: Payer: Self-pay | Admitting: Orthopaedic Surgery

## 2019-05-14 ENCOUNTER — Ambulatory Visit: Payer: Self-pay | Admitting: Physician Assistant

## 2019-07-02 ENCOUNTER — Ambulatory Visit (INDEPENDENT_AMBULATORY_CARE_PROVIDER_SITE_OTHER): Payer: BLUE CROSS/BLUE SHIELD

## 2019-07-02 ENCOUNTER — Ambulatory Visit (HOSPITAL_COMMUNITY)
Admission: EM | Admit: 2019-07-02 | Discharge: 2019-07-02 | Disposition: A | Discharge status: Home / Self Care | Payer: BLUE CROSS/BLUE SHIELD | Source: Home / Self Care

## 2019-07-02 ENCOUNTER — Emergency Department (HOSPITAL_COMMUNITY)
Admission: EM | Admit: 2019-07-02 | Discharge: 2019-07-02 | Disposition: A | Discharge status: Home / Self Care | Payer: BLUE CROSS/BLUE SHIELD | Attending: Emergency Medicine | Admitting: Emergency Medicine

## 2019-07-02 ENCOUNTER — Encounter (HOSPITAL_COMMUNITY): Payer: Self-pay | Admitting: Emergency Medicine

## 2019-07-02 ENCOUNTER — Other Ambulatory Visit: Payer: Self-pay

## 2019-07-02 ENCOUNTER — Encounter (HOSPITAL_COMMUNITY): Payer: Self-pay | Admitting: *Deleted

## 2019-07-02 DIAGNOSIS — M7918 Myalgia, other site: Secondary | ICD-10-CM | POA: Insufficient documentation

## 2019-07-02 DIAGNOSIS — R05 Cough: Secondary | ICD-10-CM | POA: Insufficient documentation

## 2019-07-02 DIAGNOSIS — I10 Essential (primary) hypertension: Secondary | ICD-10-CM | POA: Diagnosis present

## 2019-07-02 DIAGNOSIS — B349 Viral infection, unspecified: Secondary | ICD-10-CM

## 2019-07-02 DIAGNOSIS — R0602 Shortness of breath: Secondary | ICD-10-CM | POA: Insufficient documentation

## 2019-07-02 DIAGNOSIS — Z20828 Contact with and (suspected) exposure to other viral communicable diseases: Secondary | ICD-10-CM | POA: Diagnosis not present

## 2019-07-02 LAB — COMPREHENSIVE METABOLIC PANEL
ALT: 31 U/L (ref 0–44)
AST: 40 U/L (ref 15–41)
Albumin: 5.1 g/dL — ABNORMAL HIGH (ref 3.5–5.0)
Alkaline Phosphatase: 71 U/L (ref 38–126)
Anion gap: 13 (ref 5–15)
BUN: 8 mg/dL (ref 6–20)
CO2: 23 mmol/L (ref 22–32)
Calcium: 10 mg/dL (ref 8.9–10.3)
Chloride: 102 mmol/L (ref 98–111)
Creatinine, Ser: 0.98 mg/dL (ref 0.61–1.24)
GFR calc Af Amer: >60 mL/min (ref >60)
GFR calc non Af Amer: >60 mL/min (ref >60)
Glucose, Bld: 97 mg/dL (ref 70–99)
Potassium: 3.9 mmol/L (ref 3.5–5.1)
Sodium: 138 mmol/L (ref 135–145)
Total Bilirubin: 1.5 mg/dL — ABNORMAL HIGH (ref 0.3–1.2)
Total Protein: 8.4 g/dL — ABNORMAL HIGH (ref 6.5–8.1)

## 2019-07-02 LAB — CBC
HCT: 41.1 % (ref 39.0–52.0)
Hemoglobin: 13.9 g/dL (ref 13.0–17.0)
MCH: 31.6 pg (ref 26.0–34.0)
MCHC: 33.8 g/dL (ref 30.0–36.0)
MCV: 93.4 fL (ref 80.0–100.0)
Platelets: 170 10*3/uL (ref 150–400)
RBC: 4.4 MIL/uL (ref 4.22–5.81)
RDW: 12.4 % (ref 11.5–15.5)
WBC: 6.4 10*3/uL (ref 4.0–10.5)
nRBC: 0 % (ref 0.0–0.2)

## 2019-07-02 LAB — TROPONIN I (HIGH SENSITIVITY)
Troponin I (High Sensitivity): 4 ng/L (ref <18)
Troponin I (High Sensitivity): 5 ng/L (ref <18)

## 2019-07-02 MED ORDER — AMLODIPINE BESYLATE 5 MG PO TABS: 5.0000 mg | ORAL_TABLET | Freq: Every day | ORAL | 0 refills | Status: DC

## 2019-07-02 MED ORDER — LABETALOL HCL 5 MG/ML IV SOLN
10.0000 mg | Freq: Once | INTRAVENOUS | Status: AC
  Administered 2019-07-02: 10 mg via INTRAVENOUS
  Filled 2019-07-02: qty 4

## 2019-07-02 NOTE — ED Provider Notes (Signed)
[facility]    CSN: [id number] Arrival date & time: 07/02/19  1139     History   Chief Complaint No chief complaint on file.   HPI Luke Hernandez is a 53 y.o. male.   Patient is a 53 year old male with no significant past medical history.  He presents with cough, congestion, body aches, chest pain and mild shortness of breath.  This is been present over the past 2 days.  Symptoms have been waxing and waning.  He was sent here by his employer to be COVID tested.  Denies any associated fevers.  No known sick contacts.  Patient found to be very hypertensive in clinic today blood pressure was checked twice.  He is having mild dizziness.  No history of hypertension.   ROS per HPI      History reviewed. No pertinent past medical history.  There are no active problems to display for this patient.   History reviewed. No pertinent surgical history.     Home Medications    Prior to Admission medications   Medication Sig Start Date End Date Taking? Authorizing Provider  acetaminophen (TYLENOL) 500 MG tablet Take 1 tablet (500 mg total) by mouth every 6 (six) hours as needed. 05/01/19   Dahlia ByesBast, Riko Lumsden A, Luke Hernandez  ibuprofen (ADVIL) 600 MG tablet Take 1 tablet (600 mg total) by mouth every 6 (six) hours as needed. 05/01/19   Janace ArisBast, Zenaya Ulatowski A, Luke Hernandez    Family History No family history on file.  Social History Social History   Tobacco Use  . Smoking status: Never Smoker  . Smokeless tobacco: Current User  Substance Use Topics  . Alcohol use: Not on file  . Drug use: Not on file     Allergies   Patient has no known allergies.   Review of Systems Review of Systems   Physical Exam Triage Vital Signs ED Triage Vitals [07/02/19 1215]  Enc Vitals Group     BP (!) 199/109     Pulse Rate 86     Resp 20     Temp 97.8 F (36.6 C)     Temp src      SpO2 100 %     Weight      Height      Head Circumference      Peak Flow      Pain Score      Pain Loc      Pain Edu?    Excl. in GC?    No data found.  Updated Vital Signs BP (!) 203/91 Comment: obtained by Luke Bury, Luke Hernandez  Pulse 86   Temp 97.8 F (36.6 C)   Resp 20   SpO2 100%   Visual Acuity Right Eye Distance:   Left Eye Distance:   Bilateral Distance:    Right Eye Near:   Left Eye Near:    Bilateral Near:     Physical Exam Vitals signs and nursing note reviewed.  Constitutional:      General: He is not in acute distress.    Appearance: Normal appearance. He is not ill-appearing, toxic-appearing or diaphoretic.  HENT:     Head: Normocephalic and atraumatic.     Right Ear: Tympanic membrane and ear canal normal.     Left Ear: Tympanic membrane and ear canal normal.     Nose: Nose normal.     Mouth/Throat:     Pharynx: Oropharynx is clear.  Eyes:     Extraocular Movements: Extraocular movements intact.  Conjunctiva/sclera: Conjunctivae normal.     Pupils: Pupils are equal, round, and reactive to light.  Neck:     Musculoskeletal: Normal range of motion.  Cardiovascular:     Rate and Rhythm: Normal rate and regular rhythm.  Pulmonary:     Effort: Pulmonary effort is normal.     Breath sounds: Normal breath sounds.  Musculoskeletal: Normal range of motion.     Right lower leg: No edema.     Left lower leg: No edema.  Skin:    General: Skin is warm and dry.  Neurological:     General: No focal deficit present.     Mental Status: He is alert.  Psychiatric:        Mood and Affect: Mood normal.      UC Treatments / Results  Labs (all labs ordered are listed, but only abnormal results are displayed) Labs Reviewed - No data to display  EKG   Radiology Dg Chest 2 View  Result Date: 07/02/2019 CLINICAL DATA:  Cough, congestion and back pain for 2 days. EXAM: CHEST - 2 VIEW COMPARISON:  PA and lateral chest 05/01/2019 on single-view of the chest 03/03/2013. FINDINGS: The lungs are clear. Heart size is normal. No pneumothorax or pleural fluid. No acute or focal bony abnormality.  IMPRESSION: Negative chest. Electronically Signed   By: Inge Rise M.D.   On: 07/02/2019 12:52    Procedures Procedures (including critical care time)  Medications Ordered in UC Medications - No data to display  Initial Impression / Assessment and Plan / UC Course  I have reviewed the triage vital signs and the nursing notes.  Pertinent labs & imaging results that were available during my care of the patient were reviewed by me and considered in my medical decision making (see chart for details).     Patient EKG showed occasional PVC but otherwise normal. Regular rate and rhythm. Lung sounds clear and chest x-ray showed no abnormalities. My concern today is patient has had 4 blood pressure checks here ranging from 199/99 203/91 205/95.  This was checked multiple times on both arms.  He is complained of intermittent blurred vision over the last 2 weeks.  He is also had intermittent dizziness He was sent here by his work for COVID symptoms and told to be checked. I am more concerned about his blood pressure, blurred vision and dizziness. Sending to the ER for further evaluation management Final Clinical Impressions(s) / UC Diagnoses   Final diagnoses:  Viral illness     Discharge Instructions     You are very hypertensive here today.  This is been checked multiple times no history of the same You have had blurred vision for 2 weeks with some dizziness. I would like for you to be evaluated in the emergency room    ED Prescriptions    None     Controlled Substance Prescriptions [place] Controlled Substance Registry consulted? Not Applicable   Orvan July, Luke Hernandez 07/02/19 1359

## 2019-07-02 NOTE — ED Triage Notes (Signed)
Nepali interpretor [id number]. Pt is alert and oriented during interview. Pt states he [facility] due to cold symptoms. Told he couldn't return to work until negative test. Once at [facility] found to have very high bp and sent to ed. Pt denies medications. Denies HA. Repeatedly says he only needs a note for work.

## 2019-07-02 NOTE — ED Provider Notes (Signed)
Care assumed at shift change from Dr. Rex Kras.  See her note for full HPI and work-up.  Briefly, patient sent from urgent care for hypertension, however initially went for a mild cough and was looking for work note and COVID test to return to work.  He is noted to be hypertensive at urgent care as well as here in the ED.  No known history of the same.   Endorses a few episodes of very mild cp not exertional. No HA. Low suspicion hypertensive emergency. Checking renal function, trop. If neg 2nd trop, discharge with amlodipine 5mg , 30 days worth. Needs PCP but has insurance.   Physical Exam  BP (!) 192/125   Pulse 63   Temp 98.3 F (36.8 C) (Oral)   Resp 16   SpO2 100%   Physical Exam Vitals signs and nursing note reviewed.  Constitutional:      Appearance: He is well-developed.  HENT:     Head: Normocephalic and atraumatic.  Eyes:     Conjunctiva/sclera: Conjunctivae normal.  Cardiovascular:     Rate and Rhythm: Normal rate.  Pulmonary:     Effort: Pulmonary effort is normal.  Neurological:     Mental Status: He is alert.  Psychiatric:        Mood and Affect: Mood normal.        Behavior: Behavior normal.     ED Course/Procedures     Procedures  MDM  Delta troponin is negative.  Will discharge per Dr. Eddie Dibbles plan with 5 mg amlodipine daily.  PCP follow-up.       Luke Hernandez, [place] N, PA-C 07/02/19 2100    Little, Wenda Overland, MD 07/03/19 [phone]

## 2019-07-02 NOTE — Discharge Instructions (Signed)
You are very hypertensive here today.  This is been checked multiple times no history of the same You have had blurred vision for 2 weeks with some dizziness. I would like for you to be evaluated in the emergency room

## 2019-07-02 NOTE — ED Provider Notes (Signed)
[facility] Provider Note   CSN: [id number] Arrival date & time: 07/02/19  1353     History   Chief Complaint Chief Complaint  Patient presents with  . Hypertension    HPI Luke Hernandez is a 53 y.o. male.     53 year old male with no significant past medical history who presents with hypertension.  Patient presented to urgent care today for 2 days of cough associated with congestion, body aches, malaise, mild shortness of breath.  He went there for a work note and was found to be hypertensive.  He was sent here for further evaluation.  Patient denies any history of hypertension.  He does not take any medications, denies any alcohol or drug use.  No sick contacts.  He notes occasional very mild chest pain that occurs randomly, is not associated with exertion, is not associated with shortness of breath, and seems separate from his cough.  He denies any complaints currently. No headaches or vision changes.  No known FH HTN.   The history is provided by the patient.  Hypertension    History reviewed. No pertinent past medical history.  There are no active problems to display for this patient.   History reviewed. No pertinent surgical history.      Home Medications    Prior to Admission medications   Medication Sig Start Date End Date Taking? Authorizing Provider  acetaminophen (TYLENOL) 500 MG tablet Take 1 tablet (500 mg total) by mouth every 6 (six) hours as needed. 05/01/19   Dahlia ByesBast, Traci A, NP  ibuprofen (ADVIL) 600 MG tablet Take 1 tablet (600 mg total) by mouth every 6 (six) hours as needed. 05/01/19   Janace ArisBast, Traci A, NP    Family History No family history on file.  Non-contributory  Social History Social History   Tobacco Use  . Smoking status: Never Smoker  . Smokeless tobacco: Current User  Substance Use Topics  . Alcohol use: Not on file  . Drug use: Not on file     Allergies   Patient has no known allergies.    Review of Systems Review of Systems All other systems reviewed and are negative except that which was mentioned in HPI   Physical Exam Updated Vital Signs BP (!) 148/75 (BP Location: Right Arm)   Pulse 65   Temp 98.3 F (36.8 C) (Oral)   Resp 18   SpO2 100%   Physical Exam Vitals signs and nursing note reviewed.  Constitutional:      General: He is not in acute distress.    Appearance: He is well-developed.  HENT:     Head: Normocephalic and atraumatic.  Eyes:     Extraocular Movements: Extraocular movements intact.     Conjunctiva/sclera: Conjunctivae normal.     Pupils: Pupils are equal, round, and reactive to light.  Neck:     Musculoskeletal: Neck supple.  Cardiovascular:     Rate and Rhythm: Normal rate and regular rhythm.  Pulmonary:     Effort: Pulmonary effort is normal.  Abdominal:     General: There is no distension.     Palpations: Abdomen is soft.  Musculoskeletal:     Right lower leg: No edema.     Left lower leg: No edema.  Skin:    General: Skin is warm and dry.  Neurological:     Mental Status: He is alert and oriented to person, place, and time.     Gait: Gait normal.  Comments: Fluent speech  Psychiatric:        Judgment: Judgment normal.      ED Treatments / Results  Labs (all labs ordered are listed, but only abnormal results are displayed) Labs Reviewed  COMPREHENSIVE METABOLIC PANEL - Abnormal; Notable for the following components:      Result Value   Total Protein 8.4 (*)    Albumin 5.1 (*)    Total Bilirubin 1.5 (*)    All other components within normal limits  NOVEL CORONAVIRUS, NAA (HOSPITAL ORDER, SEND-OUT TO REF LAB)  CBC  TROPONIN I (HIGH SENSITIVITY)    EKG None  Radiology Dg Chest 2 View  Result Date: 07/02/2019 CLINICAL DATA:  Cough, congestion and back pain for 2 days. EXAM: CHEST - 2 VIEW COMPARISON:  PA and lateral chest 05/01/2019 on single-view of the chest 03/03/2013. FINDINGS: The lungs are clear. Heart  size is normal. No pneumothorax or pleural fluid. No acute or focal bony abnormality. IMPRESSION: Negative chest. Electronically Signed   By: Inge Rise M.D.   On: 07/02/2019 12:52    Procedures Procedures (including critical care time)  Medications Ordered in ED Medications  labetalol (NORMODYNE) injection 10 mg (10 mg Intravenous Given 07/02/19 1811)     Initial Impression / Assessment and Plan / ED Course  I have reviewed the triage vital signs and the nursing notes.  Pertinent labs & imaging results that were available during my care of the patient were reviewed by me and considered in my medical decision making (see chart for details).    EKG at [facility] reviewed, no ischemic changes, sinus rhythm. CXR at [facility] clear.   Comfortable on exam, repeatedly asking just for work note which was reason he went to UC initially.  He denies any neurologic symptoms to suggest acute neurologic process from his hypertension.  He has responded well to 1 dose of labetalol.  Lab work including CMP, CBC, and initial troponin are normal.  No evidence of kidney injury from hypertension.  I have sent COVID testing.  We will obtain a second troponin and if reassuring I anticipate discharge.  I have recommended starting him on amlodipine and establishing care with a PCP.  Patient signed out pending second troponin.  Final Clinical Impressions(s) / ED Diagnoses   Final diagnoses:  Hypertension, unspecified type    ED Discharge Orders    None       Little, Wenda Overland, MD 07/02/19 1928

## 2019-07-02 NOTE — Discharge Instructions (Addendum)
Your labs today are very reassuring. Take the medication, amlodipine, once daily in the morning, for your high blood pressure. Use the phone number on your insurance card to find a primary care provider. It is important that you establish care for recheck and treatment of your high blood pressure.  Your coronavirus test is pending. You will be notified by the hospital if your test results positive. Until you know your results, it is recommended that you isolate at home.      Person Under Monitoring Name: Luke Hernandez  Location: [place] [address]   Infection Prevention Recommendations for Individuals Confirmed to have, or Being Evaluated for, 2019 Novel Coronavirus (COVID-19) Infection Who Receive Care at Home  Individuals who are confirmed to have, or are being evaluated for, COVID-19 should follow the prevention steps below until a healthcare provider or local or state health department says they can return to normal activities.  Stay home except to get medical care You should restrict activities outside your home, except for getting medical care. Do not go to work, school, or public areas, and do not use public transportation or taxis.  Call ahead before visiting your doctor Before your medical appointment, call the healthcare provider and tell them that you have, or are being evaluated for, COVID-19 infection. This will help the healthcare providers office take steps to keep other people from getting infected. Ask your healthcare provider to call the local or state health department.  Monitor your symptoms Seek prompt medical attention if your illness is worsening (e.g., difficulty breathing). Before going to your medical appointment, call the healthcare provider and tell them that you have, or are being evaluated for, COVID-19 infection. Ask your healthcare provider to call the local or state health department.  Wear a facemask You should wear a  facemask that covers your nose and mouth when you are in the same room with other people and when you visit a healthcare provider. People who live with or visit you should also wear a facemask while they are in the same room with you.  Separate yourself from other people in your home As much as possible, you should stay in a different room from other people in your home. Also, you should use a separate bathroom, if available.  Avoid sharing household items You should not share dishes, drinking glasses, cups, eating utensils, towels, bedding, or other items with other people in your home. After using these items, you should wash them thoroughly with soap and water.  Cover your coughs and sneezes Cover your mouth and nose with a tissue when you cough or sneeze, or you can cough or sneeze into your sleeve. Throw used tissues in a lined trash can, and immediately wash your hands with soap and water for at least 20 seconds or use an alcohol-based hand rub.  Wash your [facility] your hands often and thoroughly with soap and water for at least 20 seconds. You can use an alcohol-based hand sanitizer if soap and water are not available and if your hands are not visibly dirty. Avoid touching your eyes, nose, and mouth with unwashed hands.   Prevention Steps for Caregivers and Household Members of Individuals Confirmed to have, or Being Evaluated for, COVID-19 Infection Being Cared for in the Home  If you live with, or provide care at home for, a person confirmed to have, or being evaluated for, COVID-19 infection please follow these guidelines to prevent infection:  Follow healthcare providers  instructions Make sure that you understand and can help the patient follow any healthcare provider instructions for all care.  Provide for the patients basic needs You should help the patient with basic needs in the home and provide support for getting groceries, prescriptions, and other personal  needs.  Monitor the patients symptoms If they are getting sicker, call his or her medical provider and tell them that the patient has, or is being evaluated for, COVID-19 infection. This will help the healthcare providers office take steps to keep other people from getting infected. Ask the healthcare provider to call the local or state health department.  Limit the number of people who have contact with the patient If possible, have only one caregiver for the patient. Other household members should stay in another home or place of residence. If this is not possible, they should stay in another room, or be separated from the patient as much as possible. Use a separate bathroom, if available. Restrict visitors who do not have an essential need to be in the home.  Keep older adults, very young children, and other sick people away from the patient Keep older adults, very young children, and those who have compromised immune systems or chronic health conditions away from the patient. This includes people with chronic heart, lung, or kidney conditions, diabetes, and cancer.  Ensure good ventilation Make sure that shared spaces in the home have good air flow, such as from an air conditioner or an opened window, weather permitting.  Wash your hands often Wash your hands often and thoroughly with soap and water for at least 20 seconds. You can use an alcohol based hand sanitizer if soap and water are not available and if your hands are not visibly dirty. Avoid touching your eyes, nose, and mouth with unwashed hands. Use disposable paper towels to dry your hands. If not available, use dedicated cloth towels and replace them when they become wet.  Wear a facemask and gloves Wear a disposable facemask at all times in the room and gloves when you touch or have contact with the patients blood, body fluids, and/or secretions or excretions, such as sweat, saliva, sputum, nasal mucus, vomit, urine, or  feces.  Ensure the mask fits over your nose and mouth tightly, and do not touch it during use. Throw out disposable facemasks and gloves after using them. Do not reuse. Wash your hands immediately after removing your facemask and gloves. If your personal clothing becomes contaminated, carefully remove clothing and launder. Wash your hands after handling contaminated clothing. Place all used disposable facemasks, gloves, and other waste in a lined container before disposing them with other household waste. Remove gloves and wash your hands immediately after handling these items.  Do not share dishes, glasses, or other household items with the patient Avoid sharing household items. You should not share dishes, drinking glasses, cups, eating utensils, towels, bedding, or other items with a patient who is confirmed to have, or being evaluated for, COVID-19 infection. After the person uses these items, you should wash them thoroughly with soap and water.  Wash laundry thoroughly Immediately remove and wash clothes or bedding that have blood, body fluids, and/or secretions or excretions, such as sweat, saliva, sputum, nasal mucus, vomit, urine, or feces, on them. Wear gloves when handling laundry from the patient. Read and follow directions on labels of laundry or clothing items and detergent. In general, wash and dry with the warmest temperatures recommended on the label.  Clean all areas  the individual has used often Clean all touchable surfaces, such as counters, tabletops, doorknobs, bathroom fixtures, toilets, phones, keyboards, tablets, and bedside tables, every day. Also, clean any surfaces that may have blood, body fluids, and/or secretions or excretions on them. Wear gloves when cleaning surfaces the patient has come in contact with. Use a diluted bleach solution (e.g., dilute bleach with 1 part bleach and 10 parts water) or a household disinfectant with a label that says EPA-registered for  coronaviruses. To make a bleach solution at home, add 1 tablespoon of bleach to 1 quart (4 cups) of water. For a larger supply, add  cup of bleach to 1 gallon (16 cups) of water. Read labels of cleaning products and follow recommendations provided on product labels. Labels contain instructions for safe and effective use of the cleaning product including precautions you should take when applying the product, such as wearing gloves or eye protection and making sure you have good ventilation during use of the product. Remove gloves and wash hands immediately after cleaning.  Monitor yourself for signs and symptoms of illness Caregivers and household members are considered close contacts, should monitor their health, and will be asked to limit movement outside of the home to the extent possible. Follow the monitoring steps for close contacts listed on the symptom monitoring form.   ? If you have additional questions, contact your local health department or call the epidemiologist on call at [phone] (available 24/7). ? This guidance is subject to change. For the most up-to-date guidance from [facility], please refer to their website: TripMetro.huhttps://www.cdc.gov/coronavirus/2019-ncov/hcp/guidance-prevent-spread.html

## 2019-07-02 NOTE — ED Triage Notes (Signed)
Pt c/o cold symptoms x2 days, congestion, states his job told him to come get tested for covid. Pt also c/o back pain.

## 2019-07-04 LAB — NOVEL CORONAVIRUS, NAA (HOSP ORDER, SEND-OUT TO REF LAB; TAT 18-24 HRS): SARS-CoV-2, NAA: NOT DETECTED

## 2019-07-05 ENCOUNTER — Telehealth: Payer: Self-pay

## 2019-07-05 ENCOUNTER — Other Ambulatory Visit: Payer: Self-pay

## 2019-07-05 DIAGNOSIS — Z20822 Contact with and (suspected) exposure to covid-19: Secondary | ICD-10-CM

## 2019-07-05 NOTE — Telephone Encounter (Signed)
-----   Message from Orvan July, NP sent at 07/02/2019  1:01 PM EDT ----- Cough, chest pain, shortness of breath

## 2019-07-05 NOTE — Telephone Encounter (Signed)
Using [place] interpreter [phone] home number is not a working number- mobile number is a work Proofreader.

## 2019-07-06 ENCOUNTER — Other Ambulatory Visit: Payer: Self-pay

## 2019-07-06 NOTE — Telephone Encounter (Signed)
Patient's son on DPR called [phone] using [place] ID [phone], left VM to return the call to schedule covid testing to [phone] between 0700-1900 Monday-Friday.

## 2019-07-07 ENCOUNTER — Telehealth: Payer: Self-pay

## 2019-07-07 NOTE — Telephone Encounter (Signed)
Call received from patient daughter in law.  Pt gave permission for me to read result of COVID-19 test result from hospital encounter 07/02/2019. Pt was negative which means he does not have the coronavirus. Daughter in law verbalized understanding of the result. Per daughter-in law, pt denies symptoms.

## 2019-07-07 NOTE — Telephone Encounter (Signed)
Already tested on 07/02/19.

## 2019-07-16 ENCOUNTER — Other Ambulatory Visit: Payer: Self-pay | Admitting: General Practice

## 2019-07-16 DIAGNOSIS — Z20822 Contact with and (suspected) exposure to covid-19: Secondary | ICD-10-CM

## 2019-07-19 LAB — NOVEL CORONAVIRUS, NAA: SARS-CoV-2, NAA: NOT DETECTED

## 2019-12-28 ENCOUNTER — Other Ambulatory Visit: Payer: Self-pay

## 2019-12-28 ENCOUNTER — Ambulatory Visit (HOSPITAL_COMMUNITY)
Admission: EM | Admit: 2019-12-28 | Discharge: 2019-12-28 | Disposition: A | Discharge status: Home / Self Care | Payer: BC Managed Care – PPO | Attending: Family Medicine | Admitting: Family Medicine

## 2019-12-28 ENCOUNTER — Encounter (HOSPITAL_COMMUNITY): Payer: Self-pay

## 2019-12-28 DIAGNOSIS — Z79899 Other long term (current) drug therapy: Secondary | ICD-10-CM | POA: Insufficient documentation

## 2019-12-28 DIAGNOSIS — Z760 Encounter for issue of repeat prescription: Secondary | ICD-10-CM | POA: Diagnosis present

## 2019-12-28 DIAGNOSIS — I10 Essential (primary) hypertension: Secondary | ICD-10-CM | POA: Diagnosis not present

## 2019-12-28 DIAGNOSIS — Z20822 Contact with and (suspected) exposure to covid-19: Secondary | ICD-10-CM | POA: Diagnosis not present

## 2019-12-28 DIAGNOSIS — R197 Diarrhea, unspecified: Secondary | ICD-10-CM

## 2019-12-28 MED ORDER — AMLODIPINE BESYLATE 5 MG PO TABS: 5.0000 mg | ORAL_TABLET | Freq: Every day | ORAL | 0 refills | Status: DC

## 2019-12-28 MED ORDER — AMLODIPINE BESYLATE 5 MG PO TABS: 5.0000 mg | ORAL_TABLET | Freq: Every day | ORAL | 0 refills | Status: AC

## 2019-12-28 MED ORDER — ONDANSETRON 4 MG PO TBDP: 4.0000 mg | ORAL_TABLET | Freq: Three times a day (TID) | ORAL | 0 refills | Status: DC | PRN

## 2019-12-28 NOTE — Discharge Instructions (Signed)
Your symptoms should improve over the next week as your body continues to rid the infectious cause.  For nausea: Zofran prescribed. Begin with every 6 hours, than as you are able to hold food down, take it as needed. Start with clear liquids, then move to plain foods like bananas, rice, applesauce, toast, broth, grits, oatmeal. As those food settle okay you may transition to your normal foods. Avoid spicy and greasy foods as much as possible.  For Diarrhea: This is your body's natural way of getting rid of a virus. You may try taking imodium or pepto bismol to decrease amount of stools a day, but we do not want you to stop your diarrhea.   Preventing dehydration is key! You need to replace the fluid your body is expelling. Drink plenty of fluids, may use Pedialyte or sports drinks.   Please return if you are experiencing blood in your vomit or stool or experiencing dizziness, lightheadedness, extreme fatigue, increased abdominal pain.

## 2019-12-28 NOTE — ED Provider Notes (Signed)
[facility]    CSN: [id number] Arrival date & time: 12/28/19  1116      History   Chief Complaint Chief Complaint  Patient presents with  . Diarrhea    HPI Note polytranslation via [employer] interpreter Luke Hernandez is a 54 y.o. male history of hypertension presenting today for evaluation of diarrhea, decreased appetite and medication refill.  Patient states that yesterday he began to develop loose watery stools as well as increased frequency of stools.  He denies any associated abdominal pain.  Denies nausea or vomiting.  He denies blood in the stool.  He states he has been using the restroom every 30 minutes to 1 hour.  He denies any associated URI symptoms.  Denies fevers chills or body aches.  Denies close contacts with similar symptoms.  He also reports decreased appetite which has been present over the past 3 months.  He is unsure of if he has had any weight loss.  Again patient denies having any abdominal pain.  He also is requesting refill of blood pressure medicine.  He has previously been seen for hypertensive urgency and initiated on amlodipine.  He has been out of this medicine for approximately 1 month.  He does not have a primary care.  He denies any chest pain or shortness of breath.  Denies headaches.  He does report some blurry vision, but he attributes this to not having glasses which he used to wear.  Denies acute worsening of vision.  HPI  History reviewed. No pertinent past medical history.  There are no problems to display for this patient.   History reviewed. No pertinent surgical history.     Home Medications    Prior to Admission medications   Medication Sig Start Date End Date Taking? Authorizing Provider  acetaminophen (TYLENOL) 500 MG tablet Take 1 tablet (500 mg total) by mouth every 6 (six) hours as needed. 05/01/19   Dahlia Byes A, NP  amLODipine (NORVASC) 5 MG tablet Take 1 tablet (5 mg total) by mouth daily. 12/28/19   Navraj Dreibelbis C, PA-C   ibuprofen (ADVIL) 600 MG tablet Take 1 tablet (600 mg total) by mouth every 6 (six) hours as needed. 05/01/19   Bast, Gloris [place] A, NP  ondansetron (ZOFRAN ODT) 4 MG disintegrating tablet Take 1 tablet (4 mg total) by mouth every 8 (eight) hours as needed for nausea or vomiting. 12/28/19   Shyah Cadmus, Junius Creamer, PA-C    Family History Family History  Problem Relation Age of Onset  . Healthy Mother   . Healthy Father     Social History Social History   Tobacco Use  . Smoking status: Never Smoker  . Smokeless tobacco: Current User  Substance Use Topics  . Alcohol use: Not Currently  . Drug use: Not on file     Allergies   Patient has no known allergies.   Review of Systems Review of Systems  Constitutional: Positive for appetite change. Negative for activity change, chills, fatigue and fever.  HENT: Negative for congestion, ear pain, rhinorrhea, sinus pressure, sore throat and trouble swallowing.   Eyes: Positive for visual disturbance. Negative for discharge and redness.  Respiratory: Negative for cough, chest tightness and shortness of breath.   Cardiovascular: Negative for chest pain.  Gastrointestinal: Positive for diarrhea. Negative for abdominal pain, nausea and vomiting.  Musculoskeletal: Negative for myalgias.  Skin: Negative for rash.  Neurological: Negative for dizziness, light-headedness and headaches.     Physical Exam Triage Vital Signs ED Triage  Vitals  Enc Vitals Group     BP 12/28/19 1227 (!) 157/107     Pulse Rate 12/28/19 1227 100     Resp 12/28/19 1227 16     Temp 12/28/19 1227 98.4 F (36.9 C)     Temp Source 12/28/19 1227 Oral     SpO2 12/28/19 1227 100 %     Weight --      Height --      Head Circumference --      Peak Flow --      Pain Score 12/28/19 1235 0     Pain Loc --      Pain Edu? --      Excl. in GC? --    No data found.  Updated Vital Signs BP (!) 157/107 (BP Location: Left Arm)   Pulse 100   Temp 98.4 F (36.9 C) (Oral)   Resp 16    SpO2 100%   Visual Acuity Right Eye Distance:   Left Eye Distance:   Bilateral Distance:    Right Eye Near:   Left Eye Near:    Bilateral Near:     Physical Exam Vitals and nursing note reviewed.  Constitutional:      Appearance: He is well-developed.  HENT:     Head: Normocephalic and atraumatic.     Mouth/Throat:     Comments: Oral mucosa pink and moist, no tonsillar enlargement or exudate. Posterior pharynx patent and nonerythematous, no uvula deviation or swelling. Normal phonation. Palate elevates symmetrically Eyes:     Extraocular Movements: Extraocular movements intact.     Conjunctiva/sclera: Conjunctivae normal.     Pupils: Pupils are equal, round, and reactive to light.  Cardiovascular:     Rate and Rhythm: Normal rate and regular rhythm.     Heart sounds: No murmur.  Pulmonary:     Effort: Pulmonary effort is normal. No respiratory distress.     Breath sounds: Normal breath sounds.     Comments: Breathing comfortably at rest, CTABL, no wheezing, rales or other adventitious sounds auscultated Abdominal:     Palpations: Abdomen is soft.     Tenderness: There is no abdominal tenderness.     Comments: Soft, nondistended, nontender to light and deep palpation throughout entire abdomen  Changes position without appearing uncomfortable  Musculoskeletal:     Cervical back: Neck supple.  Skin:    General: Skin is warm and dry.  Neurological:     Mental Status: He is alert.      UC Treatments / Results  Labs (all labs ordered are listed, but only abnormal results are displayed) Labs Reviewed  NOVEL CORONAVIRUS, NAA (HOSP ORDER, SEND-OUT TO REF LAB; TAT 18-24 HRS)    EKG   Radiology No results found.  Procedures Procedures (including critical care time)  Medications Ordered in UC Medications - No data to display  Initial Impression / Assessment and Plan / UC Course  I have reviewed the triage vital signs and the nursing notes.  Pertinent labs &  imaging results that were available during my care of the patient were reviewed by me and considered in my medical decision making (see chart for details).     Covid swab pending, no URI symptoms at this time.  No associated abdominal pain with diarrhea x1 to 2 days.  Most likely viral gastroenteritis and will treat symptomatically and supportively.  Push fluids.  Quarantine until Covid swab returns.  Continue to monitor for development of any respiratory symptoms.  Provided  Zofran to use as needed for any nausea in hopes to increase appetite.  Discussed with patient given this has been going on for 3 months that he should follow-up with primary care for further evaluation of this to rule out any underlying malignancy or other contributing cause to decreased appetite.  No signs of target organ damage, blood pressure is elevated today, but no hypertensive urgency.  Will refill amlodipine.  Stressed importance of establishing care with primary care for further management and monitoring of blood pressure as well as for preventative care.  Discussed strict return precautions. Patient verbalized understanding and is agreeable with plan.  Final Clinical Impressions(s) / UC Diagnoses   Final diagnoses:  Diarrhea, unspecified type  Encounter for medication refill  Essential hypertension     Discharge Instructions     Your symptoms should improve over the next week as your body continues to rid the infectious cause.  For nausea: Zofran prescribed. Begin with every 6 hours, than as you are able to hold food down, take it as needed. Start with clear liquids, then move to plain foods like bananas, rice, applesauce, toast, broth, grits, oatmeal. As those food settle okay you may transition to your normal foods. Avoid spicy and greasy foods as much as possible.  For Diarrhea: This is your body's natural way of getting rid of a virus. You may try taking imodium or pepto bismol to decrease amount of stools  a day, but we do not want you to stop your diarrhea.   Preventing dehydration is key! You need to replace the fluid your body is expelling. Drink plenty of fluids, may use Pedialyte or sports drinks.   Please return if you are experiencing blood in your vomit or stool or experiencing dizziness, lightheadedness, extreme fatigue, increased abdominal pain.    ED Prescriptions    Medication Sig Dispense Auth. Provider   amLODipine (NORVASC) 5 MG tablet  (Status: Discontinued) Take 1 tablet (5 mg total) by mouth daily. 90 tablet Chelci Wintermute C, PA-C   ondansetron (ZOFRAN ODT) 4 MG disintegrating tablet Take 1 tablet (4 mg total) by mouth every 8 (eight) hours as needed for nausea or vomiting. 20 tablet Tyrique Sporn C, PA-C   amLODipine (NORVASC) 5 MG tablet Take 1 tablet (5 mg total) by mouth daily. 90 tablet Balraj Brayfield, [place] C, PA-C     PDMP not reviewed this encounter.   Janith Lima, [place] 12/28/19 1741

## 2019-12-28 NOTE — ED Triage Notes (Addendum)
Patient presents to Urgent Care with complaints of diarrhea, loss of appetite since yesterday (denies n/v). Patient reports he is also out of his BP medication (for a month) and would like a refill.  Pt had a negative covid test within the past week.

## 2019-12-31 ENCOUNTER — Telehealth: Payer: Self-pay

## 2019-12-31 LAB — NOVEL CORONAVIRUS, NAA (HOSP ORDER, SEND-OUT TO REF LAB; TAT 18-24 HRS): SARS-CoV-2, NAA: NOT DETECTED

## 2019-12-31 NOTE — Telephone Encounter (Signed)
Pt notified of negative COVID-19 results. Understanding verbalized. (Pt called with Daughter) Joycelyn [facility]

## 2020-01-29 IMAGING — DX CHEST - 2 VIEW
2 series · 2 of 2 positions shown · non-contrast
Comparison: 03/03/2013

CLINICAL DATA: 53-year-old with cough and chest pain.

EXAM:
CHEST - 2 VIEW

[chest pa]
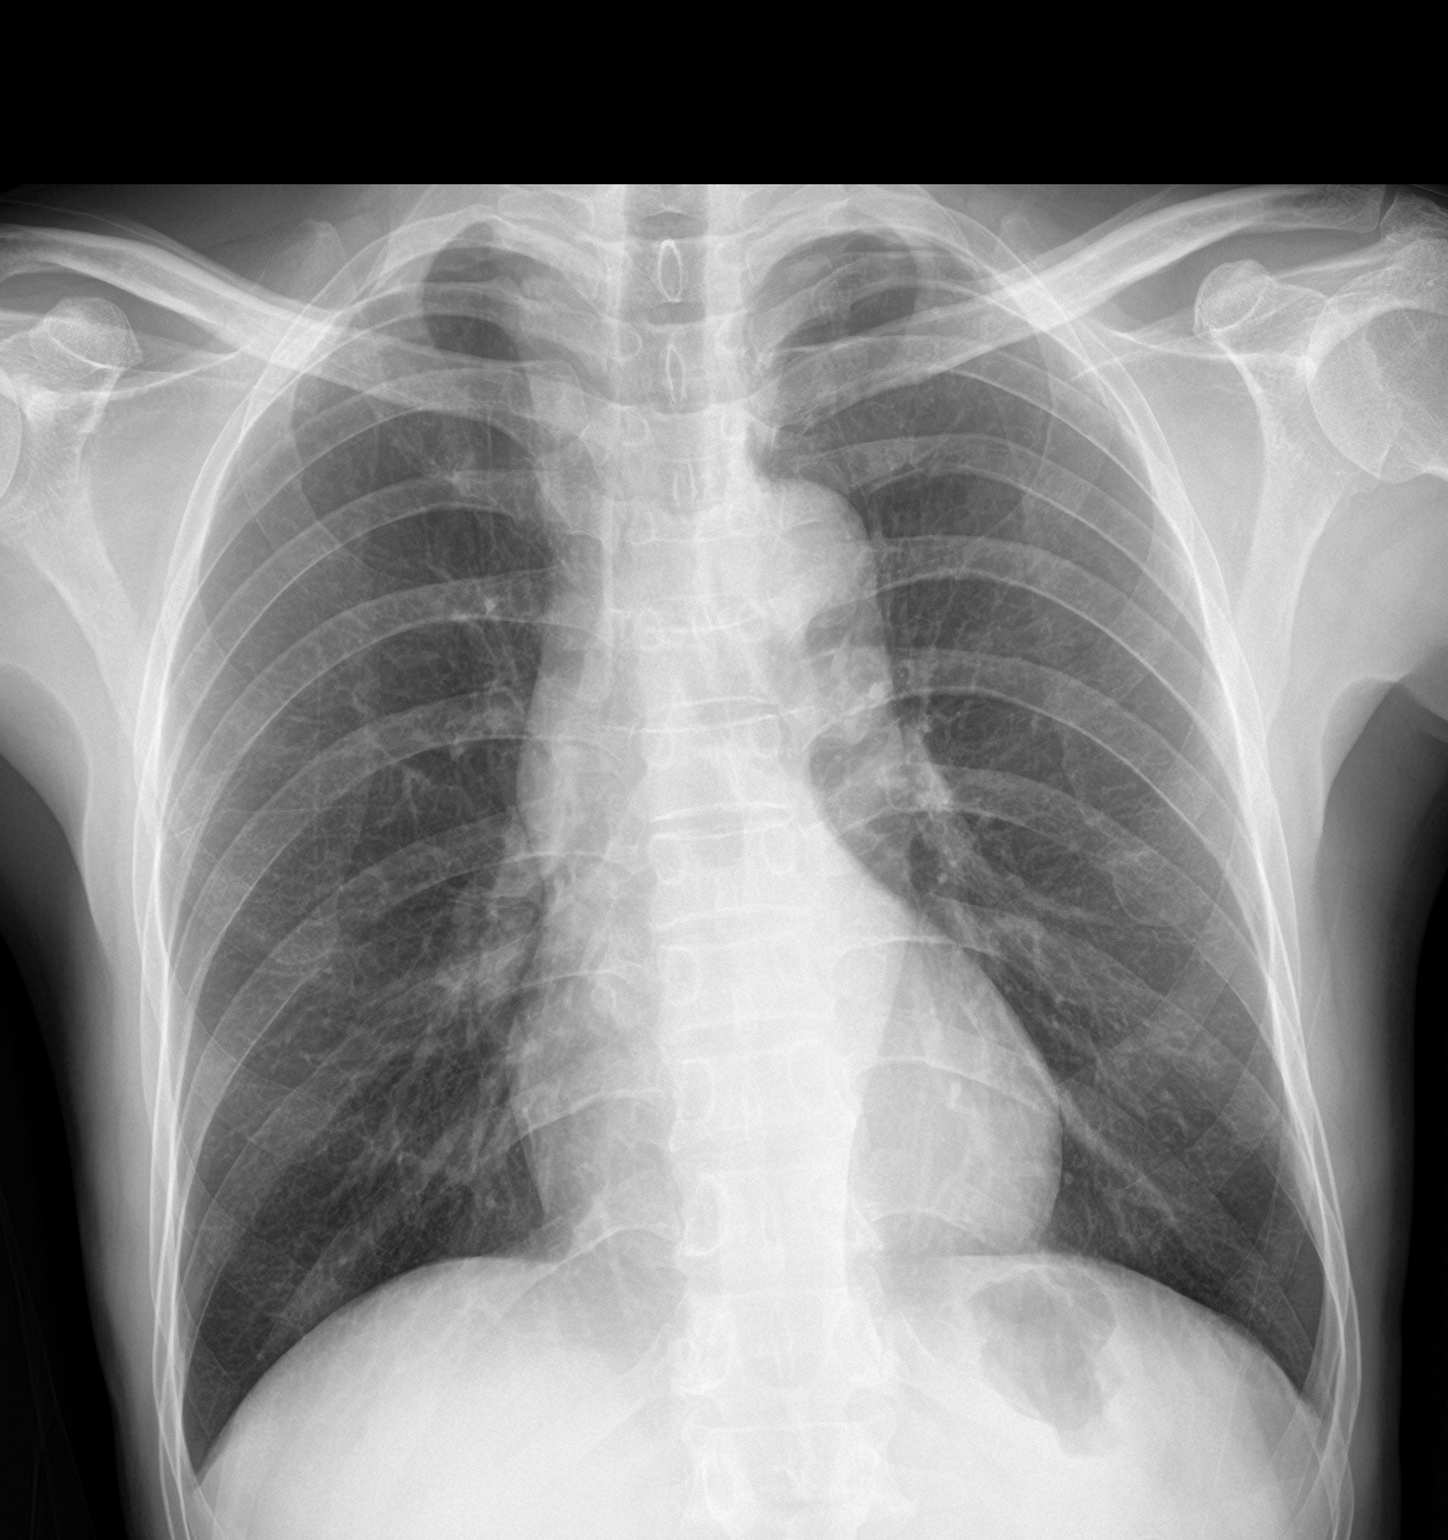

[chest lat]
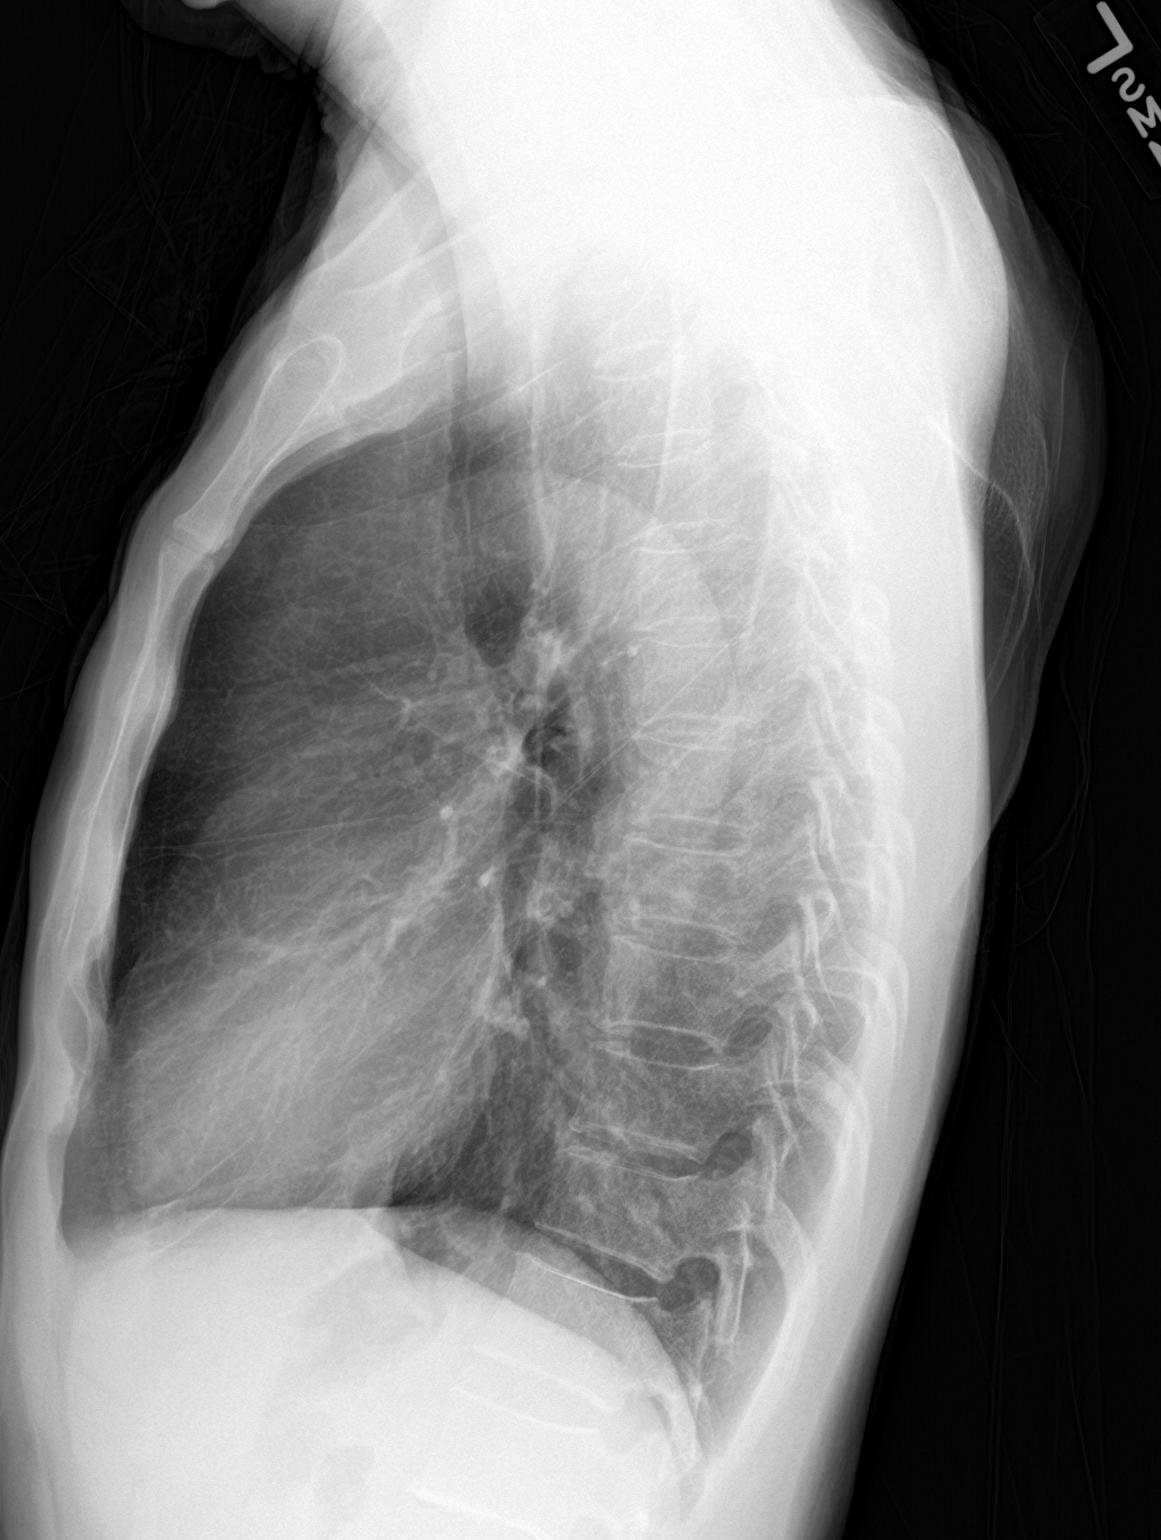

[2 of 2 positions shown; findings below may reference images not displayed]

FINDINGS: The heart size and mediastinal contours are within normal limits.
Both lungs are clear. The visualized skeletal structures are
unremarkable. No pleural effusions.
IMPRESSION: No active cardiopulmonary disease.

## 2020-03-31 IMAGING — DX CHEST - 2 VIEW
2 series · 2 of 2 positions shown · non-contrast
Comparison: PA and lateral chest 05/01/2019 on single-view of the
chest 03/03/2013.

CLINICAL DATA: Cough, congestion and back pain for 2 days.

EXAM:
CHEST - 2 VIEW

[chest pa]
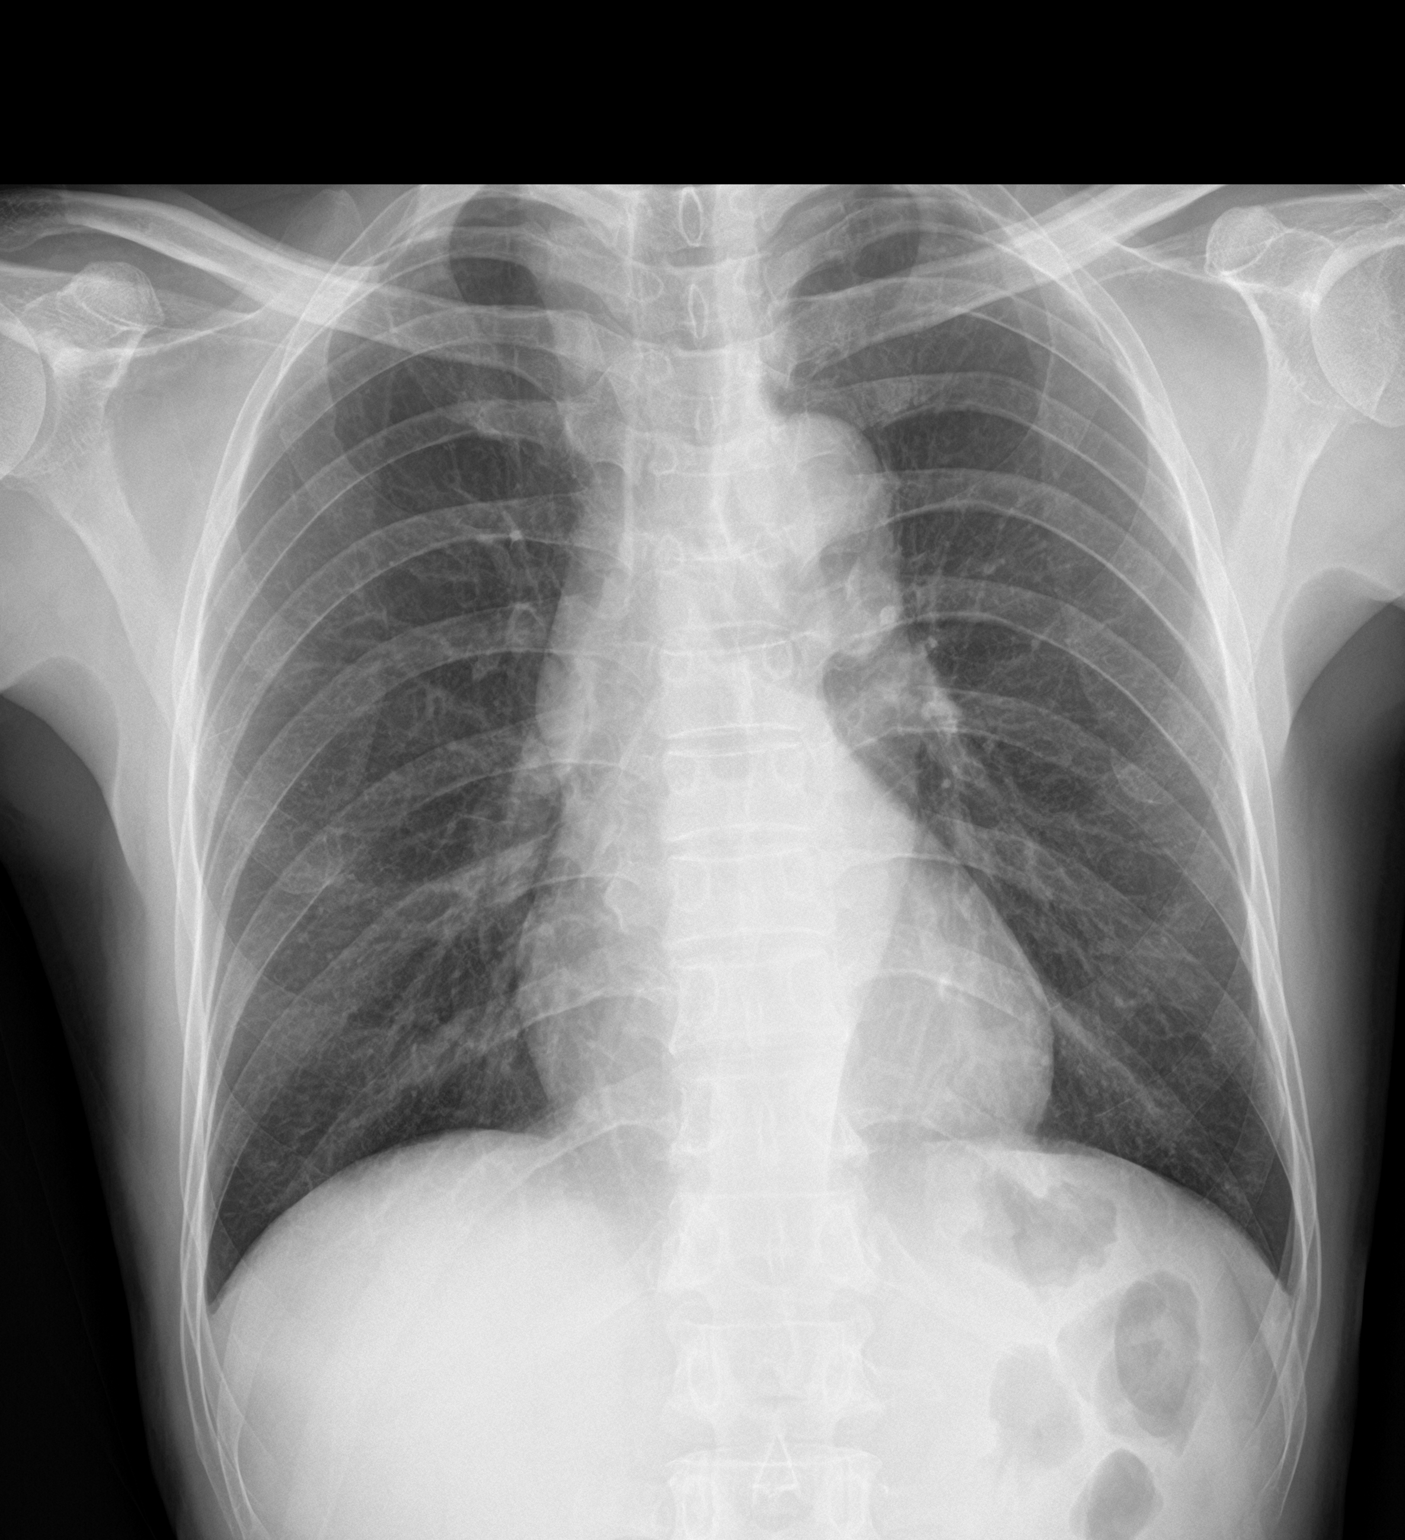

[chest lat]
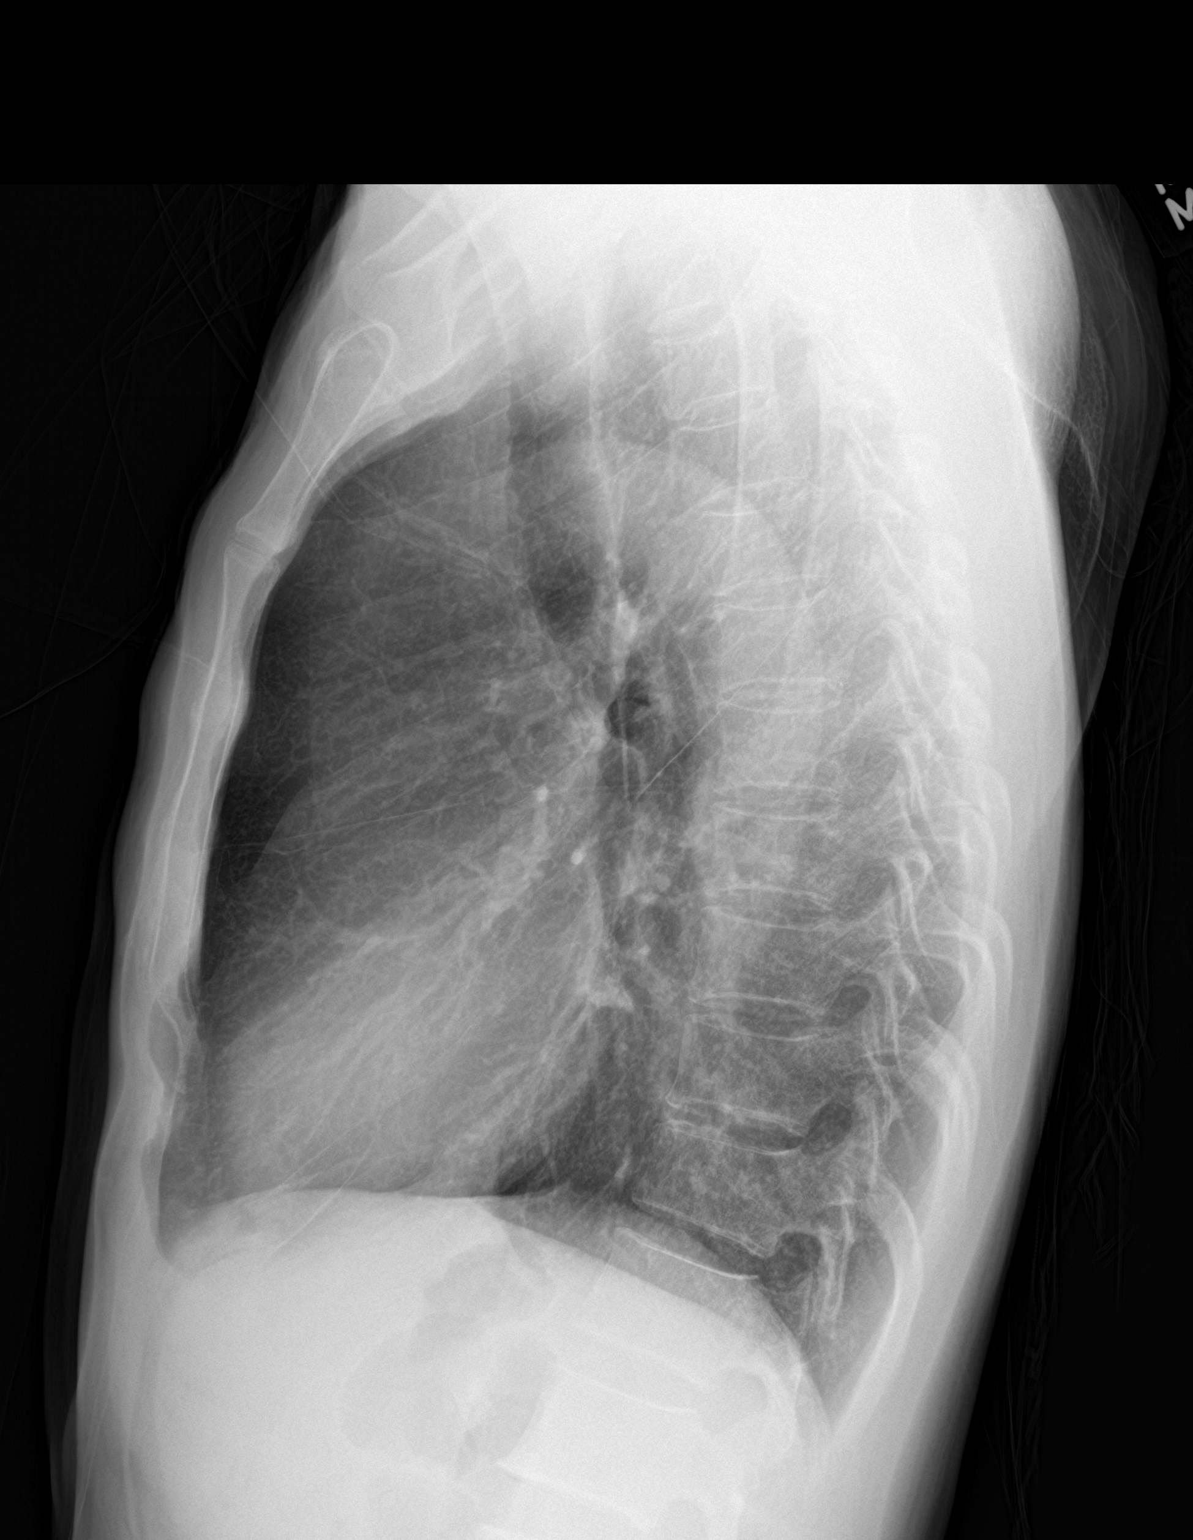

[2 of 2 positions shown; findings below may reference images not displayed]

FINDINGS: The lungs are clear. Heart size is normal. No pneumothorax or
pleural fluid. No acute or focal bony abnormality.
IMPRESSION: Negative chest.

## 2020-04-20 ENCOUNTER — Ambulatory Visit (HOSPITAL_COMMUNITY)
Admission: EM | Admit: 2020-04-20 | Discharge: 2020-04-20 | Disposition: A | Discharge status: Home / Self Care | Payer: BLUE CROSS/BLUE SHIELD | Attending: Urgent Care | Admitting: Urgent Care

## 2020-04-20 ENCOUNTER — Encounter (HOSPITAL_COMMUNITY): Payer: Self-pay

## 2020-04-20 ENCOUNTER — Other Ambulatory Visit: Payer: Self-pay

## 2020-04-20 DIAGNOSIS — K219 Gastro-esophageal reflux disease without esophagitis: Secondary | ICD-10-CM

## 2020-04-20 DIAGNOSIS — M79642 Pain in left hand: Secondary | ICD-10-CM | POA: Insufficient documentation

## 2020-04-20 DIAGNOSIS — R1084 Generalized abdominal pain: Secondary | ICD-10-CM | POA: Diagnosis not present

## 2020-04-20 DIAGNOSIS — M79671 Pain in right foot: Secondary | ICD-10-CM | POA: Diagnosis not present

## 2020-04-20 DIAGNOSIS — R42 Dizziness and giddiness: Secondary | ICD-10-CM

## 2020-04-20 DIAGNOSIS — Z20822 Contact with and (suspected) exposure to covid-19: Secondary | ICD-10-CM | POA: Insufficient documentation

## 2020-04-20 DIAGNOSIS — R0789 Other chest pain: Secondary | ICD-10-CM

## 2020-04-20 DIAGNOSIS — F17210 Nicotine dependence, cigarettes, uncomplicated: Secondary | ICD-10-CM | POA: Insufficient documentation

## 2020-04-20 DIAGNOSIS — M79672 Pain in left foot: Secondary | ICD-10-CM | POA: Diagnosis not present

## 2020-04-20 DIAGNOSIS — R519 Headache, unspecified: Secondary | ICD-10-CM | POA: Diagnosis not present

## 2020-04-20 DIAGNOSIS — M79641 Pain in right hand: Secondary | ICD-10-CM

## 2020-04-20 DIAGNOSIS — Z79899 Other long term (current) drug therapy: Secondary | ICD-10-CM | POA: Diagnosis not present

## 2020-04-20 DIAGNOSIS — I10 Essential (primary) hypertension: Secondary | ICD-10-CM | POA: Diagnosis not present

## 2020-04-20 HISTORY — DX: Essential (primary) hypertension: I10

## 2020-04-20 HISTORY — DX: Gastro-esophageal reflux disease without esophagitis: K21.9

## 2020-04-20 LAB — CBG MONITORING, ED: Glucose-Capillary: 103 mg/dL — ABNORMAL HIGH (ref 70–99)

## 2020-04-20 MED ORDER — TIZANIDINE HCL 4 MG PO TABS: 4.0000 mg | ORAL_TABLET | Freq: Three times a day (TID) | ORAL | 0 refills | Status: AC | PRN

## 2020-04-20 NOTE — ED Provider Notes (Signed)
[facility]   MRN: [id number] DOB: [birth year]  Subjective:   Luke Hernandez is a 55 y.o. male presenting for 3 day hx of acute onset mild intermittent headaches, dizziness, dull aching of hands and feet. Has a hx of HTN, takes amlodipine daily. Also takes omeprazole for GERD. Works at [employer]. His employer asked that he Has a PCP that manages his chronic conditions. Smokes a few cigarettes per day. Chews tobacco.  Does not hydrate. Reports intermittent chest wall pain over left side, was doing house work and bumped himself there.   No current facility-administered medications for this encounter.  Current Outpatient Medications:  .  omeprazole (PRILOSEC) 20 MG capsule, Take 20 mg by mouth daily., Disp: , Rfl:  .  amLODipine (NORVASC) 5 MG tablet, Take 1 tablet (5 mg total) by mouth daily., Disp: 90 tablet, Rfl: 0   No Known Allergies  Past Medical History:  Diagnosis Date  . GERD (gastroesophageal reflux disease)   . Hypertension      Past Surgical History:  Procedure Laterality Date  . VASECTOMY      Family History  Problem Relation Age of Onset  . Healthy Mother   . Healthy Father     Social History   Tobacco Use  . Smoking status: Current Some Day Smoker    Types: Cigars  . Smokeless tobacco: Current User    Types: Chew  Substance Use Topics  . Alcohol use: Yes    Alcohol/week: 7.0 standard drinks    Types: 7 Cans of beer per week  . Drug use: Never    ROS   Objective:   Vitals: BP (!) 142/82   Pulse 84   Temp 98 F (36.7 C) (Oral)   Resp 16   Ht 5\' 4"  (1.626 m)   Wt 121 lb 4.1 oz (55 kg)   SpO2 100%   BMI 20.81 kg/m   Physical Exam Constitutional:      General: He is not in acute distress.    Appearance: Normal appearance. He is well-developed. He is not ill-appearing, toxic-appearing or diaphoretic.  HENT:     Head: Normocephalic and atraumatic.     Right Ear: External ear normal.     Left Ear: External ear normal.   Nose: Nose normal.     Mouth/Throat:     Mouth: Mucous membranes are moist.     Pharynx: Oropharynx is clear.  Eyes:     General: No scleral icterus.    Extraocular Movements: Extraocular movements intact.     Pupils: Pupils are equal, round, and reactive to light.  Cardiovascular:     Rate and Rhythm: Normal rate and regular rhythm.     Heart sounds: Normal heart sounds. No murmur. No friction rub. No gallop.   Pulmonary:     Effort: Pulmonary effort is normal. No respiratory distress.     Breath sounds: Normal breath sounds. No stridor. No wheezing, rhonchi or rales.  Chest:     Chest wall: Tenderness (reproducible over area outlined) present.    Abdominal:     General: Bowel sounds are normal. There is no distension.     Palpations: Abdomen is soft. There is no mass.     Tenderness: There is abdominal tenderness (generalized, mild). There is no right CVA tenderness, left CVA tenderness, guarding or rebound.  Skin:    General: Skin is warm and dry.  Neurological:     Mental Status: He is alert and oriented to person, place,  and time.  Psychiatric:        Mood and Affect: Mood normal.        Behavior: Behavior normal.        Thought Content: Thought content normal.        Judgment: Judgment normal.    ED ECG REPORT   Date: 04/20/2020  Rate: Sinus rhythm at 75bpm.  Rhythm: normal sinus rhythm  QRS Axis: normal  Intervals: normal  ST/T Wave abnormalities: normal  Conduction Disutrbances:none  Narrative Interpretation:   Old EKG Reviewed: unchanged  I have personally reviewed the EKG tracing and agree with the computerized printout as noted.   Results for orders placed or performed during the hospital encounter of 04/20/20 (from the past 24 hour(s))  POC CBG monitoring     Status: Abnormal   Collection Time: 04/20/20 11:37 AM  Result Value Ref Range   Glucose-Capillary 103 (H) 70 - 99 mg/dL    Assessment and Plan :   PDMP not reviewed this encounter.  1.  Generalized headaches   2. Dizziness   3. Bilateral hand pain   4. Bilateral foot pain   5. Essential hypertension   6. Gastroesophageal reflux disease without esophagitis   7. Atypical chest pain   8. Generalized abdominal pain     Will manage for viral illness such as viral URI, viral syndrome, viral rhinitis, COVID-19. Counseled patient on nature of COVID-19 including modes of transmission, diagnostic testing, management and supportive care.  Offered symptomatic relief. COVID 19 testing is pending. Counseled patient on potential for adverse effects with medications prescribed/recommended today, ER and return-to-clinic precautions discussed, patient verbalized understanding.     Jaynee Eagles, PA-C 04/20/20 1216

## 2020-04-20 NOTE — ED Triage Notes (Signed)
Pt c/o mild HA, dizziness, aching hands and feetx3 days.

## 2020-04-20 NOTE — ED Notes (Signed)
I used interpreter machine to triage pt 

## 2020-04-20 NOTE — Discharge Instructions (Addendum)
Hydrate well with at least 2 liters (64 ounces) of water daily. Use tizanidine, a muscle relaxant, to help with your chest wall muscle aches, spasms. Please just use Tylenol at a dose of 500mg -650mg  once every 6 hours as needed for your aches, pains, fevers. Do not use any nonsteroidal anti-inflammatories (NSAIDs) like ibuprofen, Motrin, naproxen, Aleve, etc. which are all available over-the-counter.    We will notify you of your COVID-19 test results as they arrive and may take between 24 to 48 hours.  I encourage you to sign up for MyChart if you have not already done so as this can be the easiest way for to communicate results to you online or through a phone app.  In the meantime, if you develop worsening symptoms including fever, chest pain, shortness of breath despite our current treatment plan then please report to the emergency room as this may be a sign of worsening status from possible COVID-19 infection.   For sore throat or cough try using a honey-based tea. Use 3 teaspoons of honey with juice squeezed from half lemon. Place shaved pieces of ginger into 1/2-1 cup of water and warm over stove top. Then mix the ingredients and repeat every 4 hours as needed. Please take Tylenol 500mg -650mg  every 6 hours for aches and pains, fevers. Hydrate very well with at least 2 liters of water. Eat light meals such as soups to replenish electrolytes and soft fruits, veggies. Start an antihistamine like Zyrtec, Allegra or Claritin for postnasal drainage, sinus congestion.

## 2020-04-21 LAB — SARS CORONAVIRUS 2 (TAT 6-24 HRS): SARS Coronavirus 2: NEGATIVE

## 2021-05-29 ENCOUNTER — Encounter: Payer: Self-pay | Admitting: *Deleted

## 2021-05-29 NOTE — Congregational Nurse Program (Signed)
  Dept: [phone]   Congregational Nurse Program Note  Date of Encounter: 05/29/2021  Past Medical History: Past Medical History:  Diagnosis Date  . GERD (gastroesophageal reflux disease)   . Hypertension     Encounter Details:  CNP Questionnaire - 05/29/21 1347      Questionnaire   Do you give verbal consent to treat you today? Yes    Visit Setting Church or Organization    Location Patient Served At [facility]    Patient Status Immigrant    Medical Provider Yes    Insurance Private Insurance    Intervention Assess (including screenings)    ED Visit Averted Yes          Client came into [facility] clinic for BP check.  BP elevated.  Explained to client.  Encouraged to contact PCP with BP readings today.  Client tells me that he is on BP medication.  Educated client to follow low salt, low sugar healthy diet and to get daily exercise to improved BP and stay healthy.  Client to follow up with PCP.  Roderic Palau, RN, MSN, CNP [phone] Office [phone] Cell
